# Patient Record
Sex: Female | Born: 1950
Health system: Southern US, Community
[De-identification: ages and names within clinical notes are randomized; demographics above are authoritative.]

## PROBLEM LIST (undated history)

## (undated) DIAGNOSIS — E119 Type 2 diabetes mellitus without complications: Secondary | ICD-10-CM

## (undated) DIAGNOSIS — G4730 Sleep apnea, unspecified: Secondary | ICD-10-CM

## (undated) DIAGNOSIS — K219 Gastro-esophageal reflux disease without esophagitis: Secondary | ICD-10-CM

## (undated) DIAGNOSIS — C801 Malignant (primary) neoplasm, unspecified: Secondary | ICD-10-CM

## (undated) DIAGNOSIS — J40 Bronchitis, not specified as acute or chronic: Secondary | ICD-10-CM

## (undated) DIAGNOSIS — R609 Edema, unspecified: Secondary | ICD-10-CM

## (undated) DIAGNOSIS — B159 Hepatitis A without hepatic coma: Secondary | ICD-10-CM

## (undated) DIAGNOSIS — E079 Disorder of thyroid, unspecified: Secondary | ICD-10-CM

## (undated) DIAGNOSIS — R600 Localized edema: Secondary | ICD-10-CM

## (undated) HISTORY — PX: CHOLECYSTECTOMY: SHX55

## (undated) HISTORY — PX: BREAST LUMPECTOMY: SHX2

## (undated) HISTORY — DX: Hepatitis a without hepatic coma: B15.9

## (undated) HISTORY — DX: Disorder of thyroid, unspecified: E07.9

## (undated) HISTORY — DX: Sleep apnea, unspecified: G47.30

---

## 2005-05-22 ENCOUNTER — Ambulatory Visit: Payer: Self-pay | Admitting: Family Medicine

## 2007-07-15 ENCOUNTER — Ambulatory Visit (HOSPITAL_COMMUNITY): Admission: RE | Admit: 2007-07-15 | Discharge: 2007-07-15 | Payer: Self-pay | Admitting: *Deleted

## 2007-07-15 ENCOUNTER — Encounter (INDEPENDENT_AMBULATORY_CARE_PROVIDER_SITE_OTHER): Payer: Self-pay | Admitting: *Deleted

## 2008-06-11 ENCOUNTER — Encounter: Admission: RE | Admit: 2008-06-11 | Discharge: 2008-06-11 | Payer: Self-pay | Admitting: Internal Medicine

## 2009-08-15 ENCOUNTER — Ambulatory Visit: Payer: Self-pay | Admitting: Rheumatology

## 2010-09-09 NOTE — Op Note (Signed)
NAMECHANTIL, Monica Rocha            ACCOUNT NO.:  [id number]   MEDICAL RECORD NO.:  [id number]          PATIENT TYPE:  AMB   LOCATION:  ENDO                         FACILITY:  [facility]   PHYSICIAN:  Georgiana Spinner, M.D.    DATE OF BIRTH:  1950/08/03   DATE OF PROCEDURE:  DATE OF DISCHARGE:                               OPERATIVE REPORT   PROCEDURE:  Colonoscopy with biopsy.   INDICATIONS:  Colon polyps.   ANESTHESIA:  Fentanyl 100 mcg, Versed 10 mg.   PROCEDURE:  With the patient mildly sedated in the left lateral  decubitus position, the Pentax videoscopic colonoscope was inserted in  the rectum and passed under direct vision to the cecum identified by the  ileocecal valve and appendiceal orifice. The cecum was full of vegetable  type material but I was able to wash and suction a number of times and  able to get all of this out and view the base of the cecum after I had  photographed it. From this point, the colonoscope was slowly withdrawn  taking circumferential views of the colonic mucosa as we withdrew to the  rectum stopping only in the descending colon where two polyps were seen.  Photographs were taken and the polyps were removed using hot biopsy  forceps technique setting of 20/150 blended current. In the rectum, the  endoscope was placed in retroflexed view to view the anal canal from  above. The endoscope was straightened and withdrawn.  The patient's  vital signs and pulse oximeter remained stable.  The patient tolerated  the procedure well without apparent complications.   FINDINGS:  Two small polyps removed by hot biopsy forceps technique.   PLAN:  Await biopsy reports.  The patient will call me for results and  follow-up with me as an outpatient.  Internal hemorrhoids were also  noted.           ______________________________  Georgiana Spinner, M.D.     GMO/MEDQ  D:  07/15/2007  T:  07/15/2007  Job:  [id number]

## 2013-12-11 ENCOUNTER — Emergency Department: Payer: Self-pay | Admitting: Emergency Medicine

## 2016-05-23 ENCOUNTER — Encounter: Payer: Self-pay | Admitting: *Deleted

## 2016-05-23 ENCOUNTER — Emergency Department
Admission: EM | Admit: 2016-05-23 | Discharge: 2016-05-23 | Disposition: A | Discharge status: Home / Self Care | Payer: Federal, State, Local not specified - PPO | Attending: Student in an Organized Health Care Education/Training Program | Admitting: Student in an Organized Health Care Education/Training Program

## 2016-05-23 ENCOUNTER — Emergency Department: Payer: Federal, State, Local not specified - PPO

## 2016-05-23 DIAGNOSIS — E119 Type 2 diabetes mellitus without complications: Secondary | ICD-10-CM | POA: Insufficient documentation

## 2016-05-23 DIAGNOSIS — R69 Illness, unspecified: Secondary | ICD-10-CM

## 2016-05-23 DIAGNOSIS — J40 Bronchitis, not specified as acute or chronic: Secondary | ICD-10-CM

## 2016-05-23 DIAGNOSIS — J111 Influenza due to unidentified influenza virus with other respiratory manifestations: Secondary | ICD-10-CM

## 2016-05-23 DIAGNOSIS — R05 Cough: Secondary | ICD-10-CM | POA: Diagnosis present

## 2016-05-23 HISTORY — DX: Malignant (primary) neoplasm, unspecified: C80.1

## 2016-05-23 HISTORY — DX: Type 2 diabetes mellitus without complications: E11.9

## 2016-05-23 HISTORY — DX: Gastro-esophageal reflux disease without esophagitis: K21.9

## 2016-05-23 LAB — CBC WITH DIFFERENTIAL/PLATELET
BASOS ABS: 0.2 10*3/uL — AB (ref 0–0.1)
Basophils Relative: 2 %
EOS PCT: 2 %
Eosinophils Absolute: 0.2 10*3/uL (ref 0–0.7)
HCT: 41.5 % (ref 35.0–47.0)
HEMOGLOBIN: 13.8 g/dL (ref 12.0–16.0)
LYMPHS ABS: 1.2 10*3/uL (ref 1.0–3.6)
Lymphocytes Relative: 10 %
MCH: 27.3 pg (ref 26.0–34.0)
MCHC: 33.2 g/dL (ref 32.0–36.0)
MCV: 82.3 fL (ref 80.0–100.0)
MONO ABS: 0.8 10*3/uL (ref 0.2–0.9)
MONOS PCT: 7 %
NEUTROS PCT: 79 %
Neutro Abs: 9.5 10*3/uL — ABNORMAL HIGH (ref 1.4–6.5)
PLATELETS: 217 10*3/uL (ref 150–440)
RBC: 5.04 MIL/uL (ref 3.80–5.20)
RDW: 13.7 % (ref 11.5–14.5)
WBC: 11.9 10*3/uL — AB (ref 3.6–11.0)

## 2016-05-23 LAB — COMPREHENSIVE METABOLIC PANEL
ALBUMIN: 3.3 g/dL — AB (ref 3.5–5.0)
ALK PHOS: 77 U/L (ref 38–126)
ALT: 16 U/L (ref 14–54)
AST: 20 U/L (ref 15–41)
Anion gap: 5 (ref 5–15)
BUN: 13 mg/dL (ref 6–20)
CALCIUM: 9.4 mg/dL (ref 8.9–10.3)
CHLORIDE: 105 mmol/L (ref 101–111)
CO2: 28 mmol/L (ref 22–32)
CREATININE: 1.03 mg/dL — AB (ref 0.44–1.00)
GFR calc non Af Amer: 57 mL/min — ABNORMAL LOW (ref >60)
GLUCOSE: 98 mg/dL (ref 65–99)
Potassium: 4.1 mmol/L (ref 3.5–5.1)
SODIUM: 138 mmol/L (ref 135–145)
Total Bilirubin: 0.6 mg/dL (ref 0.3–1.2)
Total Protein: 6.9 g/dL (ref 6.5–8.1)

## 2016-05-23 LAB — INFLUENZA PANEL BY PCR (TYPE A & B)
INFLAPCR: NEGATIVE
Influenza B By PCR: NEGATIVE

## 2016-05-23 LAB — TROPONIN I: Troponin I: <0.03 ng/mL (ref <0.03)

## 2016-05-23 MED ORDER — ALBUTEROL SULFATE HFA 108 (90 BASE) MCG/ACT IN AERS: 2.0000 | INHALATION_SPRAY | Freq: Four times a day (QID) | RESPIRATORY_TRACT | 2 refills | Status: DC | PRN

## 2016-05-23 MED ORDER — AZITHROMYCIN 250 MG PO TABS: ORAL_TABLET | ORAL | 0 refills | Status: DC

## 2016-05-23 MED ORDER — DEXAMETHASONE SODIUM PHOSPHATE 10 MG/ML IJ SOLN
10.0000 mg | Freq: Once | INTRAMUSCULAR | Status: AC
  Administered 2016-05-23: 10 mg via INTRAVENOUS
  Filled 2016-05-23: qty 1

## 2016-05-23 MED ORDER — ALBUTEROL SULFATE (2.5 MG/3ML) 0.083% IN NEBU
5.0000 mg | INHALATION_SOLUTION | Freq: Once | RESPIRATORY_TRACT | Status: AC
  Administered 2016-05-23: 5 mg via RESPIRATORY_TRACT
  Filled 2016-05-23: qty 6

## 2016-05-23 NOTE — ED Triage Notes (Signed)
Pt c/o flu-like sxs x 3 days, no fever, but chills. Pt states ears, throat and chest hurt. Pain is worse w/ cough. Pt states this morning she was "running from room to room and outside because she could not breath". Pt drove self to ED, ambulatory to room for triage. Pt has weak, non-productive cough during triage.

## 2016-05-23 NOTE — ED Provider Notes (Signed)
[facility] Emergency Department Provider Note    First MD Initiated Contact with Patient 05/23/16 [phone]     (approximate)  I have reviewed the triage vital signs and the nursing notes.   HISTORY  Chief Complaint Cough and Influenza    HPI Monica Rocha is a 66 y.o. female history of breast cancer and diabetes with S reflux presents with flulike symptoms for the last 3 days. Denies any measured fevers but has felt chills. States that her throat and sinuses are congested also has been having a dry nonproductive cough.  Does not smoke. She denies a chest. Does have some discomfort when coughing. States this morning she woke up from bed due to coughing spell. She is feeling severely short of breath and that she could not cough up the phlegm. States that she then had an episode of posttussive emesis.    Past Medical History:  Diagnosis Date  . Acid reflux   . Cancer ([place])   . Diabetes mellitus without complication ([place])    History reviewed. No pertinent family history. Past Surgical History:  Procedure Laterality Date  . BREAST LUMPECTOMY Left   . CHOLECYSTECTOMY     There are no active problems to display for this patient.     Prior to Admission medications   Medication Sig Start Date End Date Taking? Authorizing Provider  albuterol (PROVENTIL HFA;VENTOLIN HFA) 108 (90 Base) MCG/ACT inhaler Inhale 2 puffs into the lungs every 6 (six) hours as needed for wheezing or shortness of breath. 05/23/16   Merlyn Lot, MD  azithromycin (ZITHROMAX Z-PAK) 250 MG tablet Take two pills on day one, then take one pill daily until complete 05/23/16   Merlyn Lot, MD    Allergies Penicillins    Social History Social History  Substance Use Topics  . Smoking status: Never Smoker  . Smokeless tobacco: Never Used  . Alcohol use No    Review of Systems Patient denies headaches, rhinorrhea, blurry vision, numbness, shortness of breath, chest pain,  edema, cough, abdominal pain, nausea, vomiting, diarrhea, dysuria, fevers, rashes or hallucinations unless otherwise stated above in HPI. ____________________________________________   PHYSICAL EXAM:  VITAL SIGNS: Vitals:   05/23/16 0800 05/23/16 0830  BP: (!) 122/94 121/80  Pulse: 99 88  Resp: (!) 28 (!) 22  Temp:      Constitutional: Alert and oriented. Well appearing and in no acute distress. Eyes: Conjunctivae are normal. PERRL. EOMI. Head: Atraumatic. Nose: No congestion/rhinnorhea. Mouth/Throat: Mucous membranes are moist.  Oropharynx non-erythematous. Neck: No stridor. Painless ROM. No cervical spine tenderness to palpation Hematological/Lymphatic/Immunilogical: No cervical lymphadenopathy. Cardiovascular: Normal rate, regular rhythm. Grossly normal heart sounds.  Good peripheral circulation. Respiratory: Normal respiratory effort.  No retractions. Lungs coarse bilateral breathsounds. Gastrointestinal: Soft and nontender. No distention. No abdominal bruits. No CVA tenderness. Musculoskeletal: No lower extremity tenderness nor edema.  No joint effusions. Neurologic:  Normal speech and language. No gross focal neurologic deficits are appreciated. No gait instability. Skin:  Skin is warm, dry and intact. No rash noted. Psychiatric: Mood and affect are normal. Speech and behavior are normal.  ____________________________________________   LABS (all labs ordered are listed, but only abnormal results are displayed)  Results for orders placed or performed during the hospital encounter of 05/23/16 (from the past 24 hour(s))  CBC with Differential/Platelet     Status: Abnormal   Collection Time: 05/23/16  7:12 AM  Result Value Ref Range   WBC 11.9 (H) 3.6 - 11.0 K/uL  RBC 5.04 3.80 - 5.20 MIL/uL   Hemoglobin 13.8 12.0 - 16.0 g/dL   HCT 41.5 35.0 - 47.0 %   MCV 82.3 80.0 - 100.0 fL   MCH 27.3 26.0 - 34.0 pg   MCHC 33.2 32.0 - 36.0 g/dL   RDW 13.7 11.5 - 14.5 %   Platelets  217 150 - 440 K/uL   Neutrophils Relative % 79 %   Lymphocytes Relative 10 %   Monocytes Relative 7 %   Eosinophils Relative 2 %   Basophils Relative 2 %   Neutro Abs 9.5 (H) 1.4 - 6.5 K/uL   Lymphs Abs 1.2 1.0 - 3.6 K/uL   Monocytes Absolute 0.8 0.2 - 0.9 K/uL   Eosinophils Absolute 0.2 0 - 0.7 K/uL   Basophils Absolute 0.2 (H) 0 - 0.1 K/uL  Comprehensive metabolic panel     Status: Abnormal   Collection Time: 05/23/16  7:12 AM  Result Value Ref Range   Sodium 138 135 - 145 mmol/L   Potassium 4.1 3.5 - 5.1 mmol/L   Chloride 105 101 - 111 mmol/L   CO2 28 22 - 32 mmol/L   Glucose, Bld 98 65 - 99 mg/dL   BUN 13 6 - 20 mg/dL   Creatinine, Ser 1.03 (H) 0.44 - 1.00 mg/dL   Calcium 9.4 8.9 - 10.3 mg/dL   Total Protein 6.9 6.5 - 8.1 g/dL   Albumin 3.3 (L) 3.5 - 5.0 g/dL   AST 20 15 - 41 U/L   ALT 16 14 - 54 U/L   Alkaline Phosphatase 77 38 - 126 U/L   Total Bilirubin 0.6 0.3 - 1.2 mg/dL   GFR calc non Af Amer 57 (L) >60 mL/min   GFR calc Af Amer >60 >60 mL/min   Anion gap 5 5 - 15  Troponin I     Status: None   Collection Time: 05/23/16  7:21 AM  Result Value Ref Range   Troponin I <0.03 <0.03 ng/mL  Influenza panel by PCR (type A & B)     Status: None   Collection Time: 05/23/16  7:28 AM  Result Value Ref Range   Influenza A By PCR NEGATIVE NEGATIVE   Influenza B By PCR NEGATIVE NEGATIVE   ____________________________________________  EKG My review and personal interpretation at Time: 7:19   Indication: chest pain  Rate: 90  Rhythm: sinus Axis: normal Other: normal intervals, no acute ischemia ____________________________________________  RADIOLOGY  I personally reviewed all radiographic images ordered to evaluate for the above acute complaints and reviewed radiology reports and findings.  These findings were personally discussed with the patient.  Please see medical record for radiology  report.  ____________________________________________   PROCEDURES  Procedure(s) performed:  Procedures    Critical Care performed: no ____________________________________________   INITIAL IMPRESSION / ASSESSMENT AND PLAN / ED COURSE  Pertinent labs & imaging results that were available during my care of the patient were reviewed by me and considered in my medical decision making (see chart for details).  ILI, flu, bronchtiis, chf, acs, dehydration  Monica Rocha is a 66 y.o. who presents to the ED with flulike illness as described above. The patient is afebrile and hemodynamically stable. She's not demonstrate any evidence of respiratory distress. She does have some faint coarse breath sounds suggestive of some component of pneumonia or bronchitis. She has no hypoxia and denies any severe chest pain. Flu test is negative. Chest x-ray shows no evidence of lobar infiltrate. Did have some improvement after  breathing treatment and steroids. Based on her cough as described above and duration of symptoms I will start the patient on a short course of antibiotics. At this point I do not feel the patient requires admission to the hospital but have provided strict return precautions and follow-up with primary care physician. She demonstrated good understanding of signs and symptoms for which she should return to the hospital  Patient was able to tolerate PO and was able to ambulate with a steady gait.  Have discussed with the patient and available family all diagnostics and treatments performed thus far and all questions were answered to the best of my ability. The patient demonstrates understanding and agreement with plan.       ____________________________________________   FINAL CLINICAL IMPRESSION(S) / ED DIAGNOSES  Final diagnoses:  Influenza-like illness  Bronchitis      NEW MEDICATIONS STARTED DURING THIS VISIT:  New Prescriptions   ALBUTEROL (PROVENTIL HFA;VENTOLIN HFA)  108 (90 BASE) MCG/ACT INHALER    Inhale 2 puffs into the lungs every 6 (six) hours as needed for wheezing or shortness of breath.   AZITHROMYCIN (ZITHROMAX Z-PAK) 250 MG TABLET    Take two pills on day one, then take one pill daily until complete     Note:  This document was prepared using Dragon voice recognition software and may include unintentional dictation errors.    Merlyn Lot, MD 05/23/16 [phone]

## 2016-05-23 NOTE — ED Notes (Signed)
Pt verbalized understanding of discharge instructions. NAD at this time. 

## 2016-05-23 NOTE — ED Notes (Signed)
Pt ambulated without any issues. Oxygen level remained 98-100% r/a.

## 2016-05-23 NOTE — Discharge Instructions (Signed)
Please return should she have any worsening or shortness of breath, chest pain or concerns.

## 2016-11-20 ENCOUNTER — Encounter: Payer: Self-pay | Admitting: Internal Medicine

## 2016-12-17 ENCOUNTER — Ambulatory Visit: Payer: Federal, State, Local not specified - PPO | Admitting: Internal Medicine

## 2017-02-09 ENCOUNTER — Ambulatory Visit: Payer: Federal, State, Local not specified - PPO | Admitting: Internal Medicine

## 2018-03-06 IMAGING — CR DG CHEST 2V
2 series · 3 of 3 positions shown · non-contrast
Comparison: None.

CLINICAL DATA: Shortness of Breath, flu-like symptoms.

EXAM:
CHEST  2 VIEW

[Series 1: chest pa · 0.14mm/px · 2 of 2 slices shown]
[im 1/2]
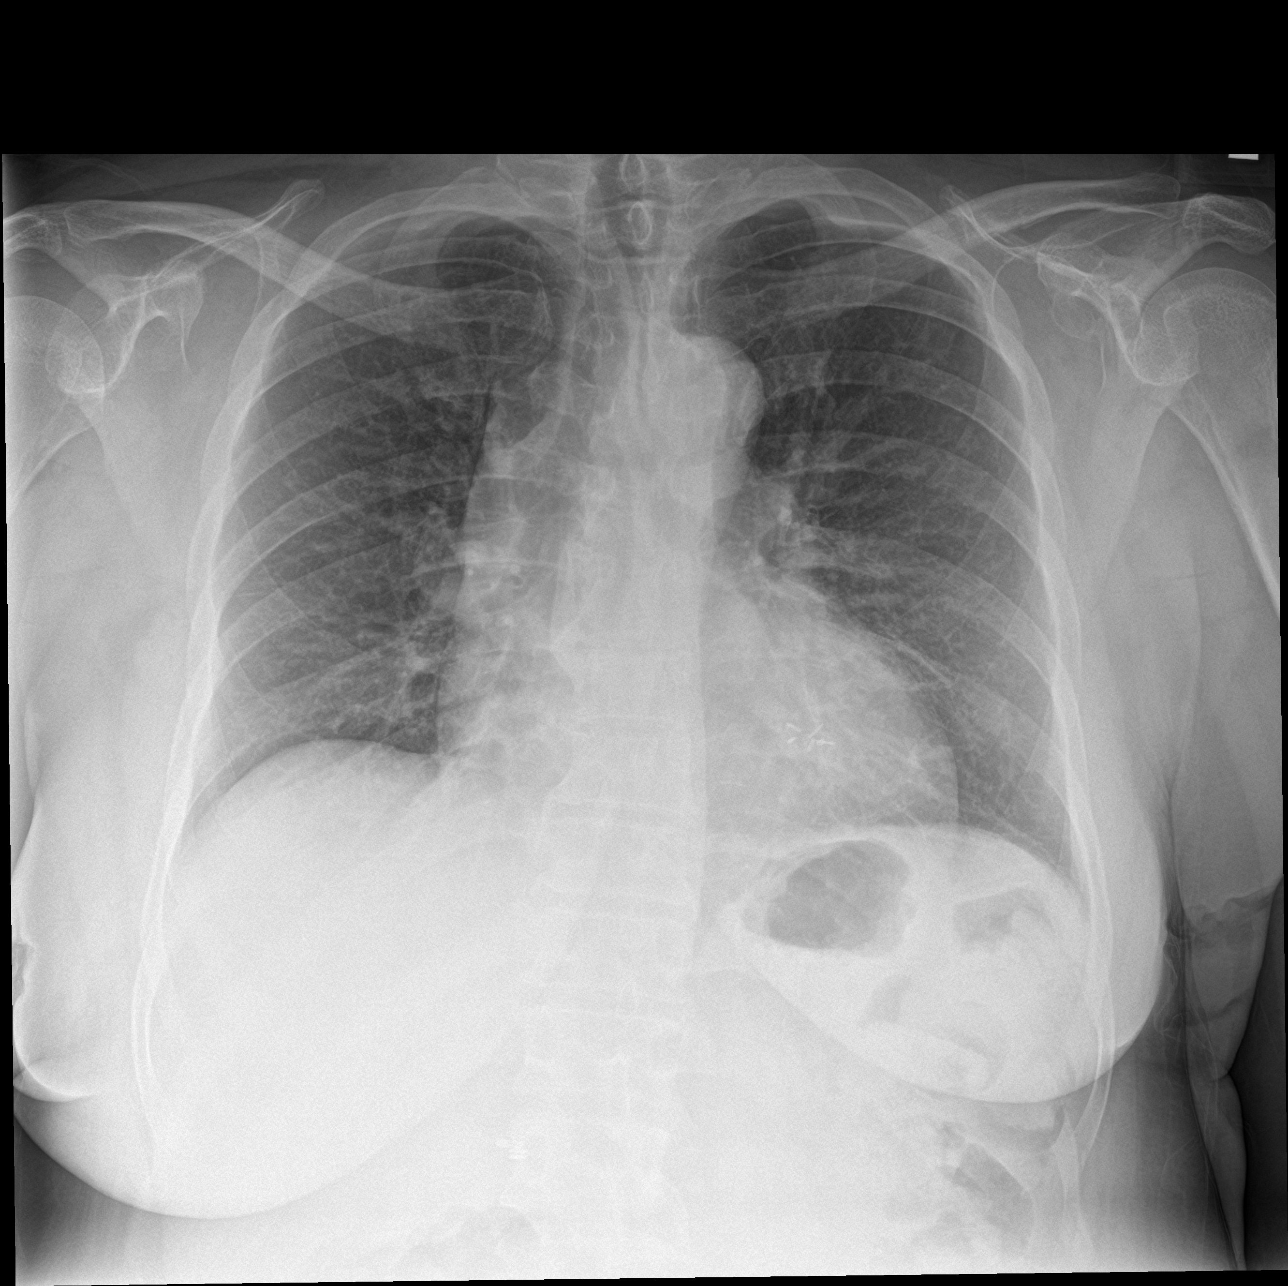
[im 2/2]
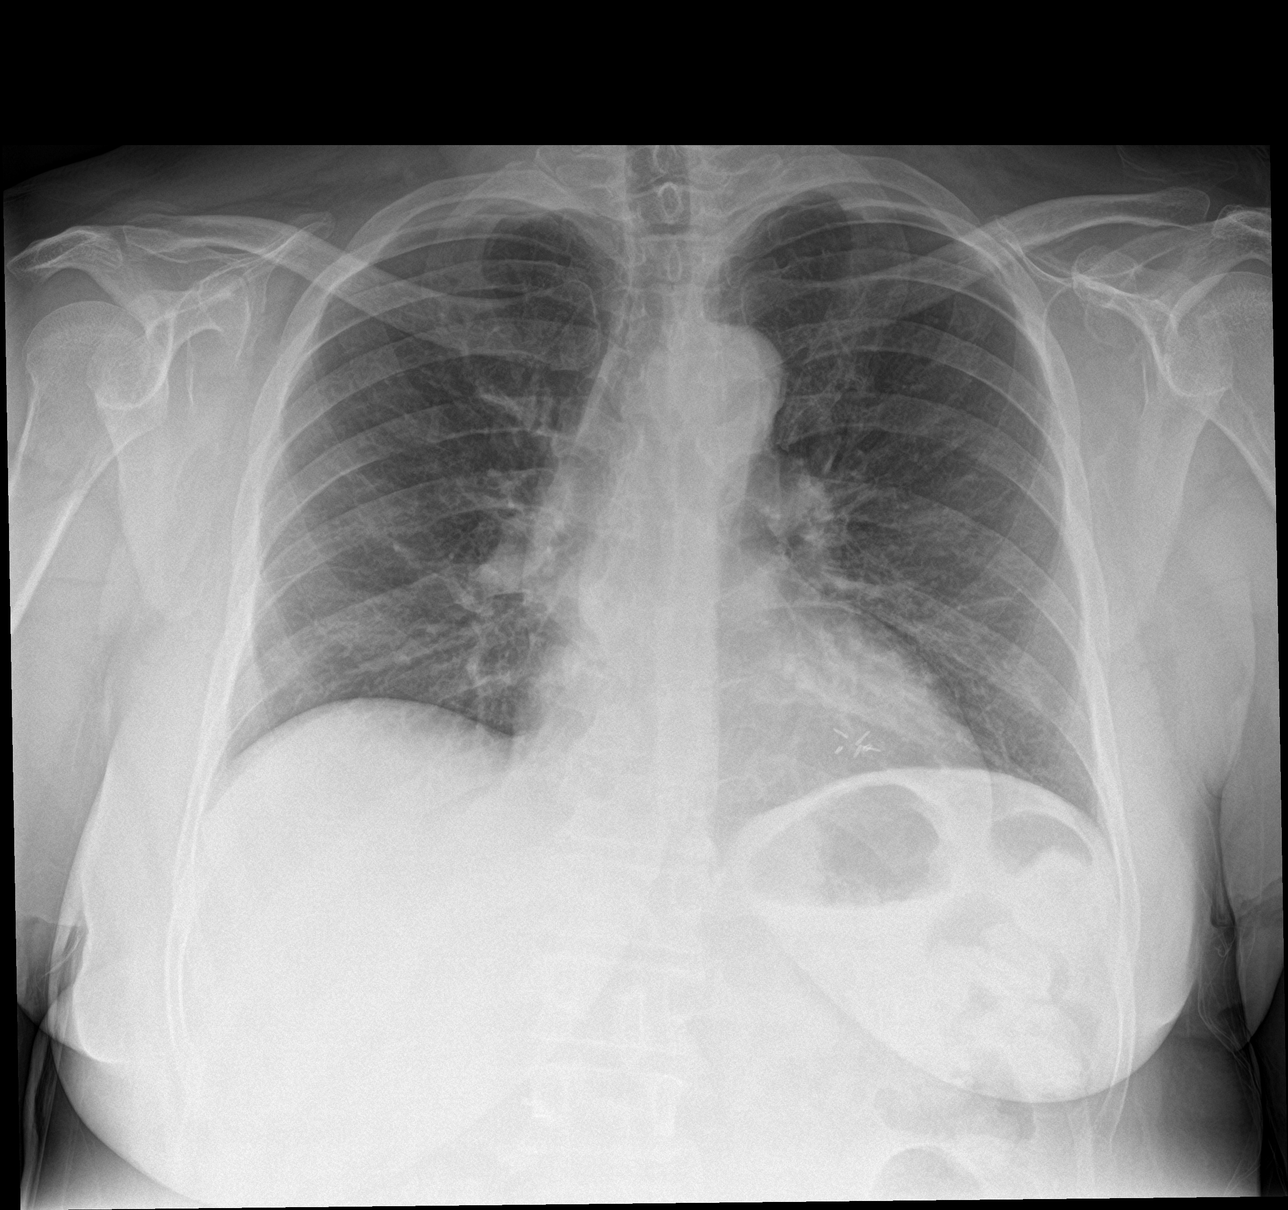

[chest lat]
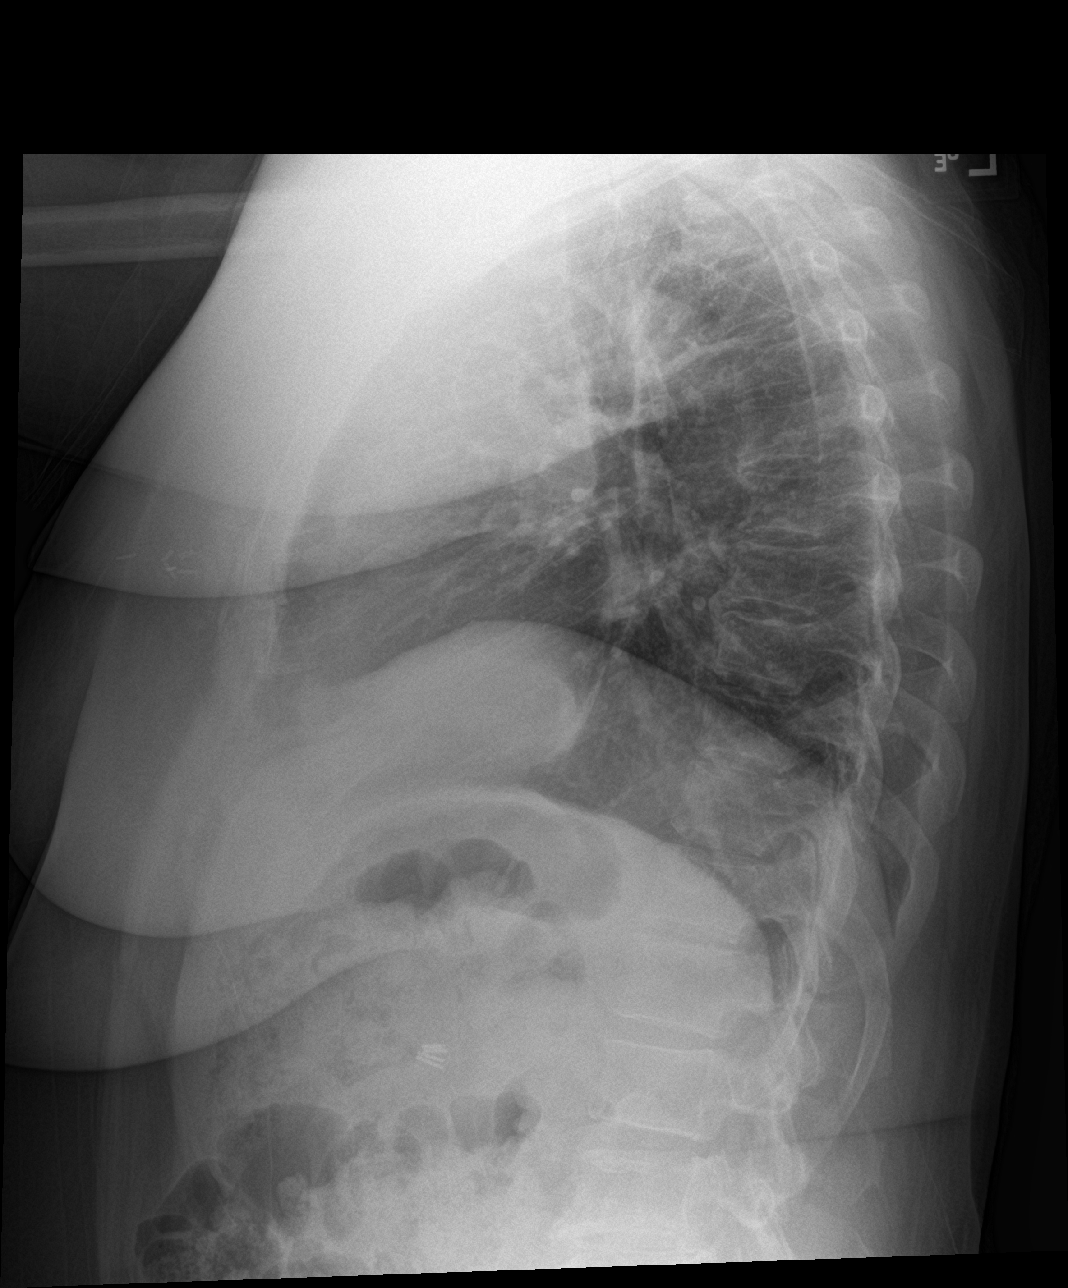

[3 of 3 positions shown; findings below may reference images not displayed]

FINDINGS: Heart is borderline in size. Lungs are clear. No effusions or acute
bony abnormality.
IMPRESSION: Cardiomegaly.  No active disease.

## 2018-09-13 ENCOUNTER — Other Ambulatory Visit: Payer: Self-pay

## 2018-09-13 ENCOUNTER — Emergency Department (HOSPITAL_BASED_OUTPATIENT_CLINIC_OR_DEPARTMENT_OTHER): Payer: Medicare Other

## 2018-09-13 ENCOUNTER — Emergency Department (HOSPITAL_BASED_OUTPATIENT_CLINIC_OR_DEPARTMENT_OTHER)
Admission: EM | Admit: 2018-09-13 | Discharge: 2018-09-13 | Disposition: A | Discharge status: Home / Self Care | Payer: Medicare Other | Attending: Emergency Medicine | Admitting: Emergency Medicine

## 2018-09-13 ENCOUNTER — Encounter (HOSPITAL_BASED_OUTPATIENT_CLINIC_OR_DEPARTMENT_OTHER): Payer: Self-pay | Admitting: Emergency Medicine

## 2018-09-13 DIAGNOSIS — Y929 Unspecified place or not applicable: Secondary | ICD-10-CM | POA: Insufficient documentation

## 2018-09-13 DIAGNOSIS — Z853 Personal history of malignant neoplasm of breast: Secondary | ICD-10-CM | POA: Diagnosis not present

## 2018-09-13 DIAGNOSIS — R2242 Localized swelling, mass and lump, left lower limb: Secondary | ICD-10-CM | POA: Insufficient documentation

## 2018-09-13 DIAGNOSIS — E119 Type 2 diabetes mellitus without complications: Secondary | ICD-10-CM | POA: Insufficient documentation

## 2018-09-13 DIAGNOSIS — Y998 Other external cause status: Secondary | ICD-10-CM | POA: Diagnosis not present

## 2018-09-13 DIAGNOSIS — R609 Edema, unspecified: Secondary | ICD-10-CM

## 2018-09-13 DIAGNOSIS — S93602A Unspecified sprain of left foot, initial encounter: Secondary | ICD-10-CM | POA: Diagnosis not present

## 2018-09-13 DIAGNOSIS — Y939 Activity, unspecified: Secondary | ICD-10-CM | POA: Insufficient documentation

## 2018-09-13 DIAGNOSIS — S99922A Unspecified injury of left foot, initial encounter: Secondary | ICD-10-CM | POA: Diagnosis present

## 2018-09-13 DIAGNOSIS — W010XXA Fall on same level from slipping, tripping and stumbling without subsequent striking against object, initial encounter: Secondary | ICD-10-CM | POA: Insufficient documentation

## 2018-09-13 HISTORY — DX: Localized edema: R60.0

## 2018-09-13 HISTORY — DX: Bronchitis, not specified as acute or chronic: J40

## 2018-09-13 HISTORY — DX: Edema, unspecified: R60.9

## 2018-09-13 NOTE — ED Triage Notes (Signed)
Miss-judged a step 4 days ago and fell, causing pain to top of left foot.  There is no obvious swelling or deformity.  No redness.  Skin is intact.  Pt sts she is concerned about having a blood clot there because she has had a blood clot in the past in her left leg.

## 2018-09-13 NOTE — ED Notes (Signed)
Pt states missed step 4 days ago c/o left ankle pain  Slight swelling

## 2018-09-13 NOTE — ED Provider Notes (Signed)
[facility] EMERGENCY DEPARTMENT Provider Note   CSN: [id number] Arrival date & time: 09/13/18  1154    History   Chief Complaint Chief Complaint  Patient presents with  . Foot Pain    HPI Monica Rocha is a 68 y.o. female.     HPI Patient presents with left foot pain.  Around 4 days ago she tripped and injured her left foot.  Scraped her right hand but otherwise no real injury.  Now has increased swelling in the left foot and on the left lower leg.  Does have a previous history of DVT.  States that was when she had been on some hormonal treatment however.  No longer on anticoagulation.  States she is worried about DVT in the foot or leg now.  No chest pain or trouble breathing.  Has been able ambulate. Past Medical History:  Diagnosis Date  . Acid reflux   . Bronchitis   . Cancer ([place])   . Diabetes mellitus without complication ([place])   . Peripheral edema     There are no active problems to display for this patient.   Past Surgical History:  Procedure Laterality Date  . BREAST LUMPECTOMY Left   . CHOLECYSTECTOMY       OB History   No obstetric history on file.      Home Medications    Prior to Admission medications   Medication Sig Start Date End Date Taking? Authorizing Provider  albuterol (PROVENTIL HFA;VENTOLIN HFA) 108 (90 Base) MCG/ACT inhaler Inhale 2 puffs into the lungs every 6 (six) hours as needed for wheezing or shortness of breath. 05/23/16   Merlyn Lot, MD  azithromycin (ZITHROMAX Z-PAK) 250 MG tablet Take two pills on day one, then take one pill daily until complete 05/23/16   Merlyn Lot, MD    Family History No family history on file.  Social History Social History   Tobacco Use  . Smoking status: Never Smoker  . Smokeless tobacco: Never Used  Substance Use Topics  . Alcohol use: No  . Drug use: No     Allergies   Penicillins   Review of Systems Review of Systems  Constitutional: Negative for appetite  change.  Cardiovascular: Positive for leg swelling. Negative for chest pain.  Musculoskeletal: Negative for back pain.       Left foot pain and swelling  Skin: Negative for wound.  Neurological: Negative for weakness and numbness.     Physical Exam Updated Vital Signs BP 133/88 (BP Location: Right Arm)   Pulse 88   Temp 98 F (36.7 C) (Oral)   Resp 16   Ht 5' 2.5" (1.588 m)   Wt 96.2 kg   SpO2 97%   BMI 38.16 kg/m   Physical Exam Vitals signs and nursing note reviewed.  Cardiovascular:     Rate and Rhythm: Normal rate and regular rhythm.  Pulmonary:     Breath sounds: No wheezing.  Abdominal:     Tenderness: There is no abdominal tenderness.  Musculoskeletal:     Right lower leg: Edema present.     Left lower leg: Edema present.     Comments: Pitting edema bilateral lower extremities, worse on left lower leg compared to right.  Also some tenderness over midfoot on left.  No tenderness over ankle.  Good range of motion right ankle.  Skin intact.  Skin:    Capillary Refill: Capillary refill takes less than 2 seconds.  Neurological:     Mental Status: She is  alert.      ED Treatments / Results  Labs (all labs ordered are listed, but only abnormal results are displayed) Labs Reviewed - No data to display  EKG None  Radiology US Venous Img Lower Unilateral Left  Result Date: 09/13/2018 CLINICAL DATA:  Left lower extremity pain and swelling, recent fall EXAM: LEFT LOWER EXTREMITY VENOUS DOPPLER ULTRASOUND TECHNIQUE: Gray-scale sonography with graded compression, as well as color Doppler and duplex ultrasound were performed to evaluate the lower extremity deep venous systems from the level of the common femoral vein and including the common femoral, femoral, profunda femoral, popliteal and calf veins including the posterior tibial, peroneal and gastrocnemius veins when visible. The superficial great saphenous vein was also interrogated. Spectral Doppler was utilized to  evaluate flow at rest and with distal augmentation maneuvers in the common femoral, femoral and popliteal veins. COMPARISON:  None. FINDINGS: Contralateral Common Femoral Vein: Respiratory phasicity is normal and symmetric with the symptomatic side. No evidence of thrombus. Normal compressibility. Common Femoral Vein: No evidence of thrombus. Normal compressibility, respiratory phasicity and response to augmentation. Saphenofemoral Junction: No evidence of thrombus. Normal compressibility and flow on color Doppler imaging. Profunda Femoral Vein: No evidence of thrombus. Normal compressibility and flow on color Doppler imaging. Femoral Vein: No evidence of thrombus. Normal compressibility, respiratory phasicity and response to augmentation. Popliteal Vein: No evidence of thrombus. Normal compressibility, respiratory phasicity and response to augmentation. Calf Veins: No evidence of thrombus. Normal compressibility and flow on color Doppler imaging. Superficial Great Saphenous Vein: No evidence of thrombus. Normal compressibility. Venous Reflux:  Not assessed Other Findings:  Subcutaneous edema noted peripherally. IMPRESSION: No evidence of deep venous thrombosis. Electronically Signed   By: Jerilynn Mages.  Shick M.D.   On: 09/13/2018 13:05   Dg Foot Complete Left  Result Date: 09/13/2018 CLINICAL DATA:  68 year old female with a history of left foot pain EXAM: LEFT FOOT - COMPLETE 3+ VIEW COMPARISON:  None. FINDINGS: No acute displaced fracture. Lateral view demonstrates nonspecific soft tissue swelling on the dorsum of the forefoot. Degenerative changes of the midfoot and the hindfoot. No radiopaque foreign body. No subluxation/dislocation. IMPRESSION: Negative for acute bony abnormality. Nonspecific soft tissue swelling on the dorsum of the forefoot Electronically Signed   By: Corrie Mckusick D.O.   On: 09/13/2018 12:38    Procedures Procedures (including critical care time)  Medications Ordered in ED Medications - No  data to display   Initial Impression / Assessment and Plan / ED Course  I have reviewed the triage vital signs and the nursing notes.  Pertinent labs & imaging results that were available during my care of the patient were reviewed by me and considered in my medical decision making (see chart for details).        Patient with foot sprain.  X-ray reassuring.  However did have edema going up the leg that was worse on left side compared to right.  Previous DVT.  Ultrasound done and is negative.  Discharge home.  Final Clinical Impressions(s) / ED Diagnoses   Final diagnoses:  None    ED Discharge Orders    None       Davonna Belling, MD 09/13/18 1321

## 2019-05-30 ENCOUNTER — Telehealth: Payer: Self-pay | Admitting: Neurology

## 2019-05-30 NOTE — Telephone Encounter (Signed)
Dr. Rolena Infante is requesting patient be seen by Dr. Jaynee Eagles, but we haven't received a referral. I have reached out to patient and we talked briefly about her getting a referral from her PCP. She will reach out to them tomorrow and make the request.

## 2019-09-05 ENCOUNTER — Encounter: Payer: Self-pay | Admitting: Neurology

## 2019-09-05 ENCOUNTER — Ambulatory Visit (INDEPENDENT_AMBULATORY_CARE_PROVIDER_SITE_OTHER): Payer: Medicare Other | Admitting: Neurology

## 2019-09-05 ENCOUNTER — Other Ambulatory Visit: Payer: Self-pay

## 2019-09-05 VITALS — BP 127/72 | HR 83 | Ht 63.0 in | Wt 215.0 lb

## 2019-09-05 DIAGNOSIS — R202 Paresthesia of skin: Secondary | ICD-10-CM | POA: Insufficient documentation

## 2019-09-05 DIAGNOSIS — E119 Type 2 diabetes mellitus without complications: Secondary | ICD-10-CM | POA: Insufficient documentation

## 2019-09-05 NOTE — Progress Notes (Signed)
PATIENT: Monica Rocha DOB: [birth year]  Chief Complaint  Patient presents with  . New Patient (Initial Visit)    RM 4, alone. PCP/Referring: Dr. Theda Sers. Vision: 20/50 with correction bilaterally.  . Peripheral Neuropathy    Complaining of neuropathy in her feet. Right leg is worse than the left. She has tried gabapentin but it did not help and made her too sleepy.     HISTORICAL  [name] is a 69 year old female, seen in request by her primary care physician Dr. Theda Sers, [place] for evaluation of peripheral neuropathy, initial evaluation was on Sep 05, 2019  I have reviewed and summarized the referring note from the referring physician.  She has past medical history of hypertension, diabetes, well-controlled, A1c was 6.2 in November 2020.  She also had a history of left breast cancer, status post surgery followed by radiation therapy, but did not require chemotherapy.  She complains of gradual onset right foot paresthesia since 2016, intermittent, usually involving right toes, plantar surface, occasionally radiating discomfort to right calf, lateral leg, lateral time, she denies significant low back pain, no gait abnormality, no bowel and bladder incontinence, occasionally left side involvement, but much less frequent, less severe  For her right foot, and leg symptoms, she take Aleve as needed less than once a week, works well, previously tried gabapentin, Lyrica, complains of drowsiness  She exercise regularly, walking 3 miles each day without difficulty  Doppler study of left lower extremity in 2020 was negative for DVT  Laboratory evaluation from [facility] here [facility] Normal CBC, hemoglobin of 14.3, CMP, creatinine of 1.0, A1c 6.2, TSH 1.08 in November 2020  REVIEW OF SYSTEMS: Full 14 system review of systems performed and notable only for as above All other review of systems were negative.  ALLERGIES: Allergies  Allergen Reactions  . Penicillins      HOME MEDICATIONS: Current Outpatient Medications  Medication Sig Dispense Refill  . aspirin 325 MG tablet Take 325 mg by mouth daily.    . Cholecalciferol 25 MCG (1000 UT) CHEW Chew by mouth.    . losartan-hydrochlorothiazide (HYZAAR) 50-12.5 MG tablet Take 1 tablet by mouth daily.    . metFORMIN (GLUCOPHAGE) 500 MG tablet Take 500 mg by mouth 2 (two) times daily with a meal.    . omeprazole (PRILOSEC) 20 MG capsule Take 20 mg by mouth daily.     No current facility-administered medications for this visit.    PAST MEDICAL HISTORY: Past Medical History:  Diagnosis Date  . Acid reflux   . Bronchitis   . Cancer ([place])   . Diabetes mellitus without complication ([place])   . Peripheral edema     PAST SURGICAL HISTORY: Past Surgical History:  Procedure Laterality Date  . BREAST LUMPECTOMY Left   . CHOLECYSTECTOMY      FAMILY HISTORY: Family History  Problem Relation Age of Onset  . Stroke Mother   . Cancer Father     SOCIAL HISTORY: Social History   Socioeconomic History  . Marital status: Divorced    Spouse name: Not on file  . Number of children: Not on file  . Years of education: Not on file  . Highest education level: Not on file  Occupational History  . Not on file  Tobacco Use  . Smoking status: Never Smoker  . Smokeless tobacco: Never Used  Substance and Sexual Activity  . Alcohol use: No  . Drug use: No  . Sexual activity: Not on file  Other Topics  Concern  . Not on file  Social History Narrative  . Not on file   Social Determinants of Health   Financial Resource Strain:   . Difficulty of Paying Living Expenses:   Food Insecurity:   . Worried About Charity fundraiser in the Last Year:   . Arboriculturist in the Last Year:   Transportation Needs:   . Film/video editor (Medical):   Marland Kitchen Lack of Transportation (Non-Medical):   Physical Activity:   . Days of Exercise per Week:   . Minutes of Exercise per Session:   Stress:   . Feeling of  Stress :   Social Connections:   . Frequency of Communication with Friends and Family:   . Frequency of Social Gatherings with Friends and Family:   . Attends Religious Services:   . Active Member of Clubs or Organizations:   . Attends Archivist Meetings:   Marland Kitchen Marital Status:   Intimate Partner Violence:   . Fear of Current or Ex-Partner:   . Emotionally Abused:   Marland Kitchen Physically Abused:   . Sexually Abused:      PHYSICAL EXAM   Vitals:   09/05/19 1024  BP: 127/72  Pulse: 83  Weight: 215 lb (97.5 kg)  Height: 5\' 3"  (1.6 m)    Not recorded      Body mass index is 38.09 kg/m.  PHYSICAL EXAMNIATION:  Gen: NAD, conversant, well nourised, well groomed                     Cardiovascular: Regular rate rhythm, no peripheral edema, warm, nontender. Eyes: Conjunctivae clear without exudates or hemorrhage Neck: Supple, no carotid bruits. Pulmonary: Clear to auscultation bilaterally   NEUROLOGICAL EXAM:  MENTAL STATUS: Speech:    Speech is normal; fluent and spontaneous with normal comprehension.  Cognition:     Orientation to time, place and person     Normal recent and remote memory     Normal Attention span and concentration     Normal Language, naming, repeating,spontaneous speech     Fund of knowledge   CRANIAL NERVES: CN II: Visual fields are full to confrontation. Pupils are round equal and briskly reactive to light. CN III, IV, VI: extraocular movement are normal. No ptosis. CN V: Facial sensation is intact to light touch CN VII: Face is symmetric with normal eye closure  CN VIII: Hearing is normal to causal conversation. CN IX, X: Phonation is normal. CN XI: Head turning and shoulder shrug are intact  MOTOR: There is no pronator drift of out-stretched arms. Muscle bulk and tone are normal. Muscle strength is normal.  REFLEXES: Reflexes are 2+ and symmetric at the biceps, triceps, knees, and ankles. Plantar responses are flexor.  SENSORY: Intact  to light touch, pinprick and vibratory sensation are intact in fingers and toes.  COORDINATION: There is no trunk or limb dysmetria noted.  GAIT/STANCE: She can get up from seated position arms crossed, steady, able to stand up on tiptoes or heels without difficulty Romberg is absent.   DIAGNOSTIC DATA (LABS, IMAGING, TESTING) - I reviewed patient records, labs, notes, testing and imaging myself where available.   ASSESSMENT AND PLAN  Avarie Muter is a 69 y.o. female   Few years history of right foot paresthesia, occasionally left foot involvement  Essentially normal neurological examination, does have a history of well-controlled diabetes, most recent A1c was 6.2 in November 2020.  The apparent asymmetry on history, suggestive of  right lumbar radiculopathy, may be with superimposed mild peripheral neuropathy,  Patient overall is functioning well, after discussed with patient, decided to hold off evaluation at this point, call clinic for worsening symptoms,  She has tried Neurontin, gabapentin for symptoms control, complains of drowsiness, it is okay to take NSAIDs as needed   Marcial Pacas, M.D. Ph.D.  [facility] [address] [place], [place] [address] Ph: [phone] Fax: [phone]  CC: Janie Morning, DO

## 2020-06-26 IMAGING — DX LEFT FOOT - COMPLETE 3+ VIEW
3 series · 3 of 3 positions shown · non-contrast
Comparison: None.

CLINICAL DATA: 67-year-old female with a history of left foot pain

EXAM:
LEFT FOOT - COMPLETE 3+ VIEW

[foot ap]
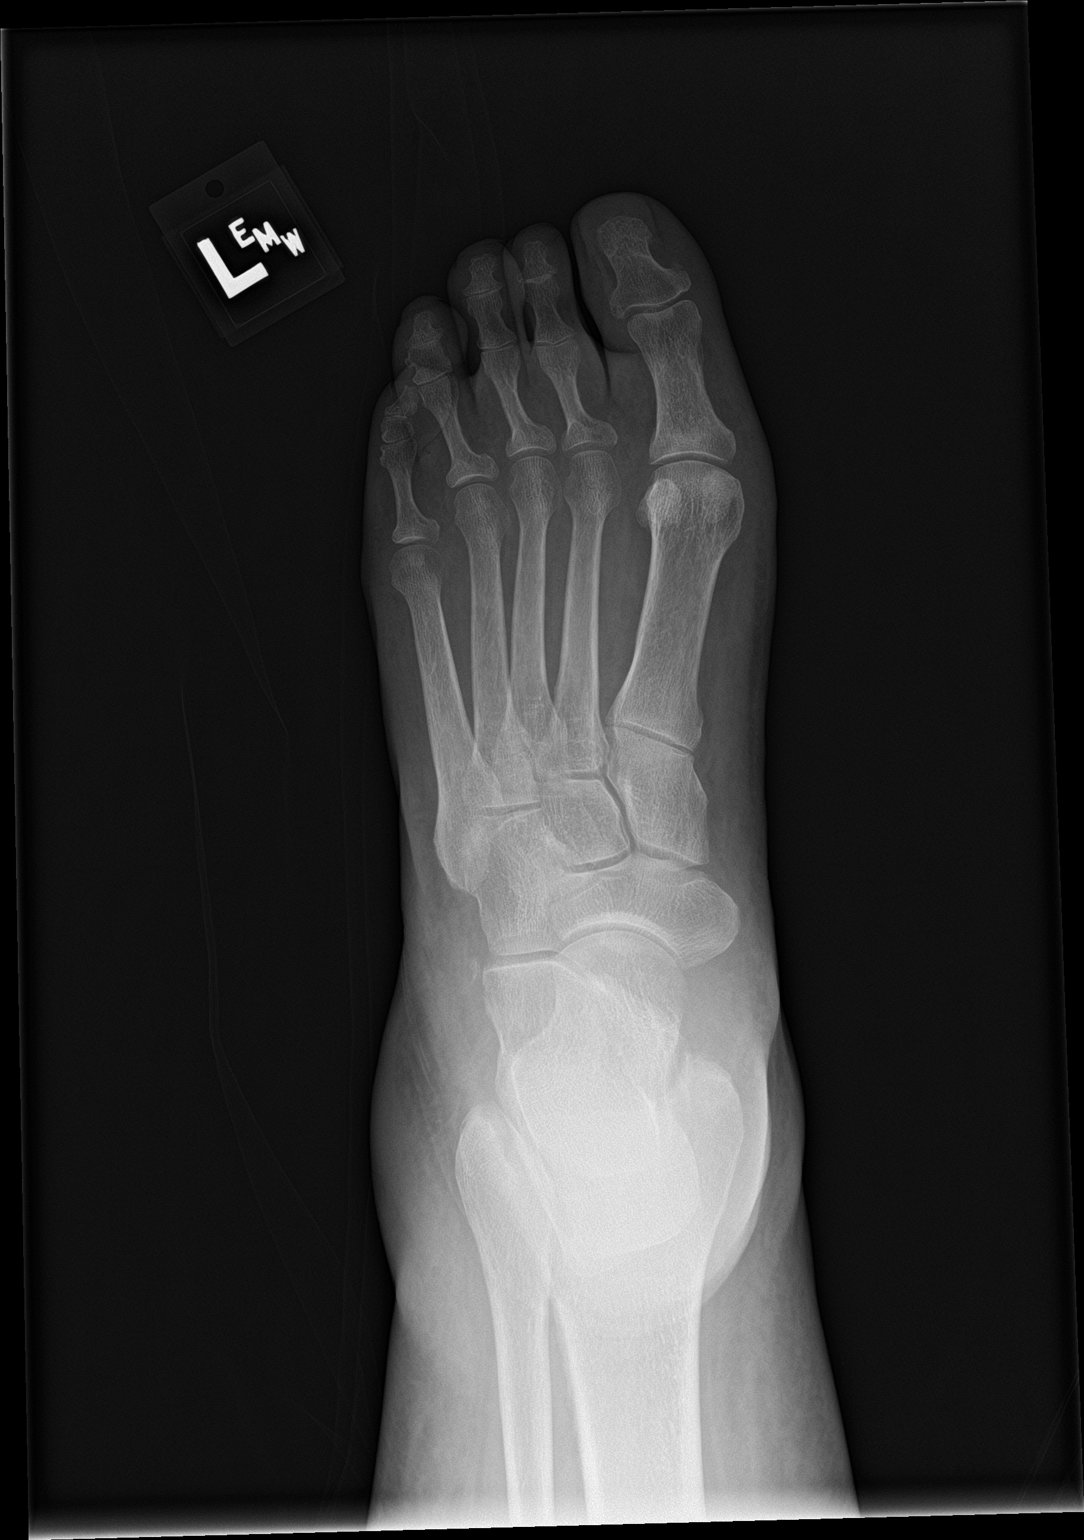

[foot obl]
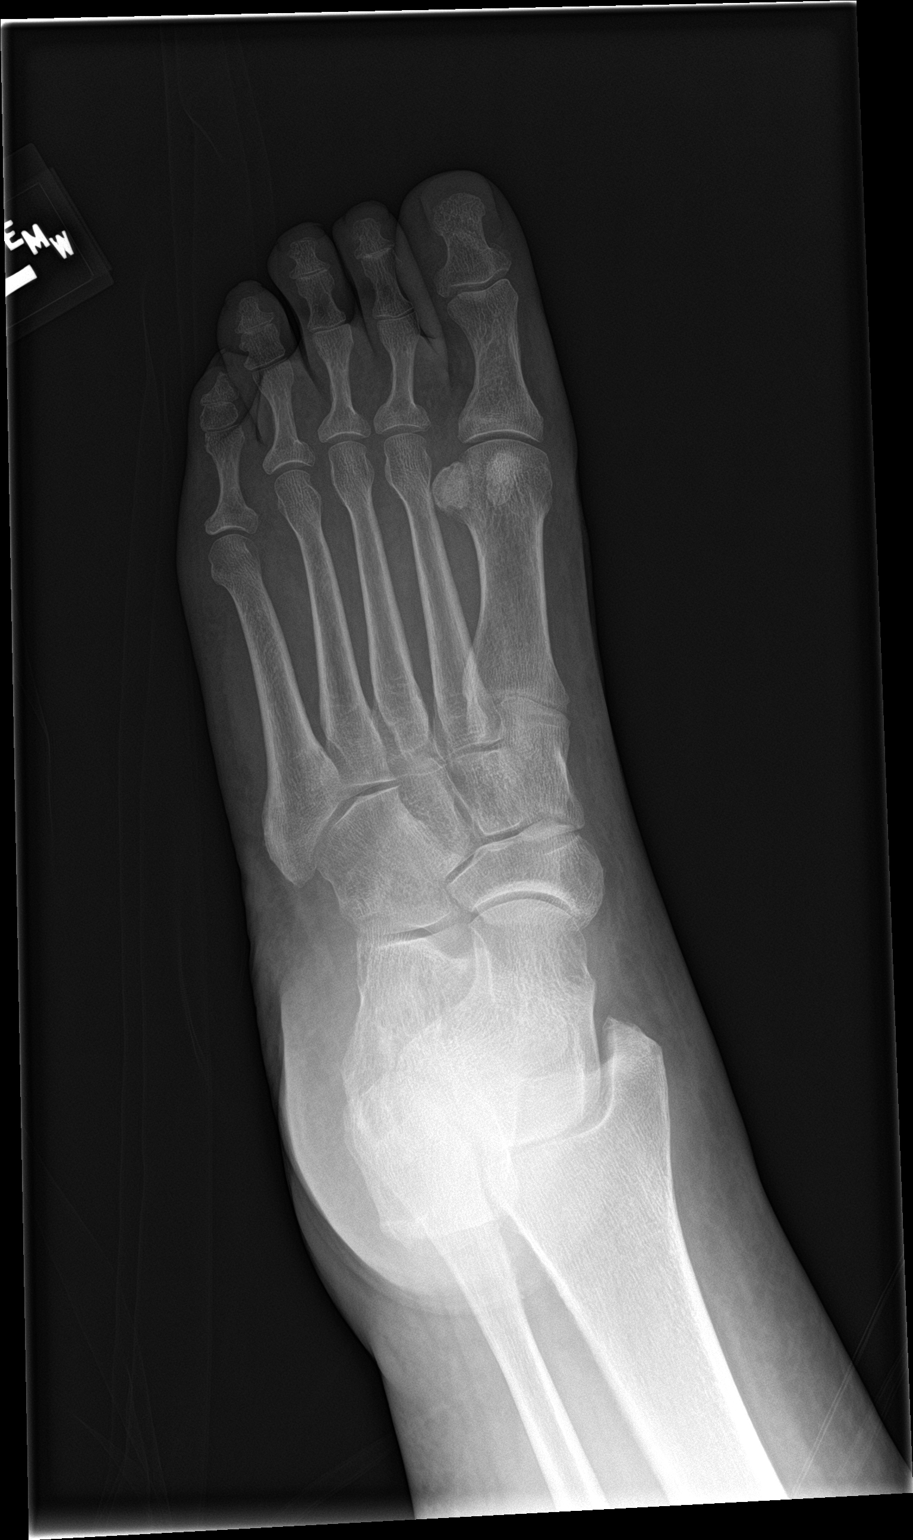

[foot lat]
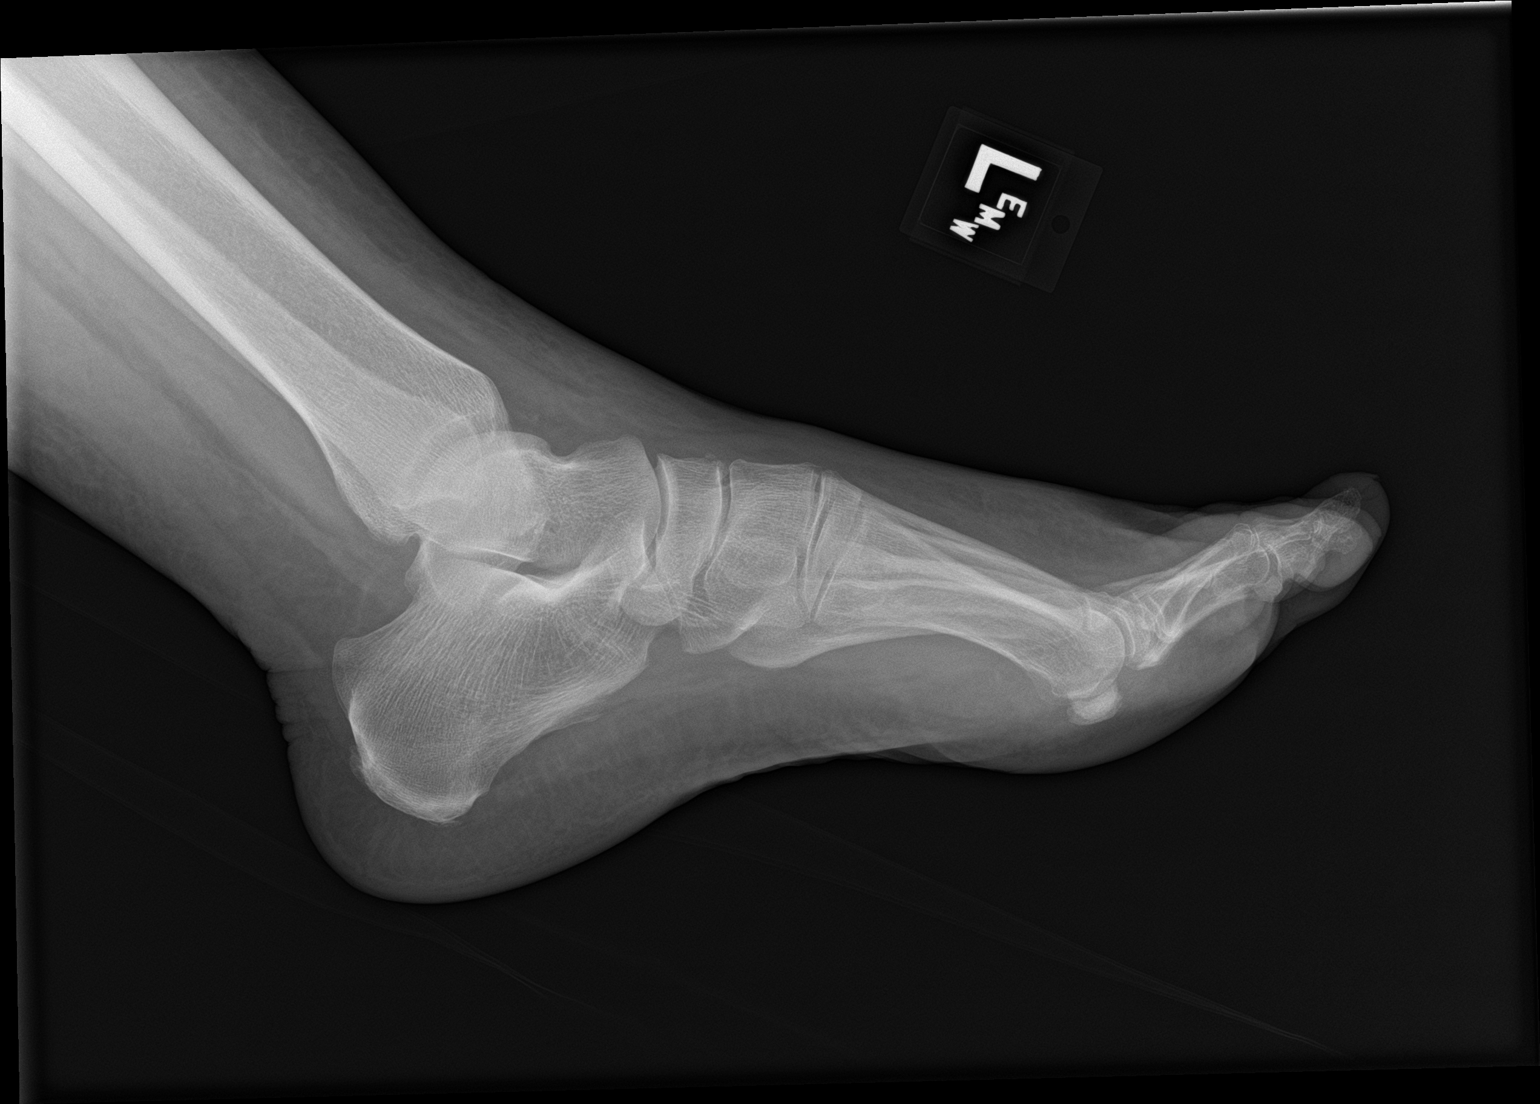

[3 of 3 positions shown; findings below may reference images not displayed]

FINDINGS: No acute displaced fracture. Lateral view demonstrates nonspecific
soft tissue swelling on the dorsum of the forefoot. Degenerative
changes of the midfoot and the hindfoot. No radiopaque foreign body.
No subluxation/dislocation.
IMPRESSION: Negative for acute bony abnormality.

Nonspecific soft tissue swelling on the dorsum of the forefoot

## 2021-04-04 ENCOUNTER — Encounter: Payer: Self-pay | Admitting: Gastroenterology

## 2021-05-09 ENCOUNTER — Encounter: Payer: Self-pay | Admitting: Gastroenterology

## 2021-05-09 ENCOUNTER — Ambulatory Visit (INDEPENDENT_AMBULATORY_CARE_PROVIDER_SITE_OTHER): Payer: Medicare Other | Admitting: Gastroenterology

## 2021-05-09 ENCOUNTER — Other Ambulatory Visit: Payer: Self-pay

## 2021-05-09 VITALS — BP 128/68 | HR 90 | Ht 63.0 in | Wt 169.4 lb

## 2021-05-09 DIAGNOSIS — K219 Gastro-esophageal reflux disease without esophagitis: Secondary | ICD-10-CM | POA: Diagnosis not present

## 2021-05-09 DIAGNOSIS — R1319 Other dysphagia: Secondary | ICD-10-CM | POA: Diagnosis not present

## 2021-05-09 NOTE — Progress Notes (Signed)
Chief Complaint: For GI eval  Referring Provider:  Janie Morning, DO      ASSESSMENT AND PLAN;   #1. GERD with eso dysphagia. D/d includes eso stricture, Schatzki's ring, motility disorder, EoE, pill induced esophagitis, r/o esophageal carcinoma/extrinsic compression d/t goiter.  #2. H/O polyps (tubular adenoma) Feb 2019  #3. IBS-C  Plan: -Omeprazole 20 mg p.o. QD, 1/2 hr before breakfast. #30.  11 refills. -Ba swallow with Ba tablet as well. -EGD with eso bx and possibly dil.  Discussed risks and benefits including small but definite risks of eso perforation, bleeding, risks of anesthesia.  The benefits were also discussed. Consent forms given. -I have instructed patient to chew foods especially meats and breads well and eat slowly. -Miralax 17g po qd to continue. -Colon 07/2022    HPI:    Monica Rocha is a 71 y.o. female  With goiter/hyperthyroidism, breast CA, DM2, OSA, GERD Referred by [facility] for GI eval  C/O Solid food dysphagia-mostly breads and meats- neck/lower chest area x 2 years, intermittently-has goiter on physical exam.  Has reflux which is better with omeprazole.  She has been on omeprazole for 5-6 years.  Had EGD April 2019 as below which showed small hiatal hernia.  She underwent dilatation to 54Fr with improvement x 2 years.  On review of previous notes-she also had EGD February 2016 and was found to have erosive gastritis.  The biopsies were consistent with chronic active gastritis with intestinal metaplasia and low-grade dysplasia.  Of note that most recent biopsies in 2019 did not show any intestinal metaplasia.  The biopsies were negative for H. pylori.  She has longstanding H/O constipation with pellet-like stools. Dx with IBS-C. Better with high-fiber diet and MiraLAX every day.  Has not tried any other medications. Negative colonoscopy except for small colonic polyp as below 07/2017.  For goiter/hyperthyroidism she does follow-up with  endocrinologist.  No melena or hematochezia.  Has history of colonic polyps as below.  No fever chills or night sweats.  No recent weight loss.  No jaundice dark urine or pale stools.  No family history of colon cancer or colonic polyps.    Previous GI work-up:  EGD 08/19/2017 (Dr Harl Favor) -Small hiatal hernia -Dilatation to 47 French -Negative esophageal biopsies for EOE. -Normal stomach/duodenum. Bx-negative.  These biopsies did not reveal any intestinal metaplasia.  Colonoscopy 08/19/2017 (Dr Harl Favor) -3 mm colonic polyp in distal ascending colon/hepatic flexure, internal hemorrhoids, otherwise normal. -Bx-tubular adenoma.  Repeat in 5 years.  Past Medical History:  Diagnosis Date   Acid reflux    Bronchitis    Cancer ([place])    Diabetes mellitus without complication ([place])    Hepatitis A    Peripheral edema    Sleep apnea    Thyroid disease     Past Surgical History:  Procedure Laterality Date   BREAST LUMPECTOMY Left    CHOLECYSTECTOMY      Family History  Problem Relation Age of Onset   Stroke Mother    Cancer Father    Cancer Sister        gall bladder   Colon cancer Neg Hx    Rectal cancer Neg Hx     Social History   Tobacco Use   Smoking status: Never   Smokeless tobacco: Never  Vaping Use   Vaping Use: Never used  Substance Use Topics   Alcohol use: No   Drug use: No    Current Outpatient Medications  Medication Sig Dispense Refill  aspirin 325 MG tablet Take 325 mg by mouth daily.     losartan-hydrochlorothiazide (HYZAAR) 50-12.5 MG tablet Take 1 tablet by mouth daily.     metFORMIN (GLUCOPHAGE) 500 MG tablet Take 500 mg by mouth 2 (two) times daily with a meal.     methimazole (TAPAZOLE) 10 MG tablet Take 10 mg by mouth 2 (two) times daily.     metoprolol succinate (TOPROL-XL) 25 MG 24 hr tablet Take 25 mg by mouth daily.     omeprazole (PRILOSEC) 20 MG capsule Take 20 mg by mouth daily.     No current  facility-administered medications for this visit.    Allergies  Allergen Reactions   Penicillins     Review of Systems:  Constitutional: Denies fever, chills, diaphoresis, appetite change and fatigue.  HEENT: Denies photophobia, eye pain, redness, hearing loss, ear pain, congestion, sore throat, rhinorrhea, sneezing, mouth sores, neck pain, neck stiffness and tinnitus.   Respiratory: Denies SOB, DOE, cough, chest tightness,  and wheezing.   Cardiovascular: Denies chest pain, palpitations and leg swelling.  Genitourinary: Denies dysuria, urgency, frequency, hematuria, flank pain and difficulty urinating.  Musculoskeletal: Denies myalgias, back pain, joint swelling, arthralgias and gait problem.  Skin: No rash.  Neurological: Denies dizziness, seizures, syncope, weakness, light-headedness, numbness and headaches.  Hematological: Denies adenopathy. Easy bruising, personal or family bleeding history  Psychiatric/Behavioral: No anxiety or depression     Physical Exam:    BP 128/68    Pulse 90    Ht 5\' 3"  (1.6 m)    Wt 169 lb 6 oz (76.8 kg)    SpO2 98%    BMI 30.00 kg/m  Wt Readings from Last 3 Encounters:  05/09/21 169 lb 6 oz (76.8 kg)  09/05/19 215 lb (97.5 kg)  09/13/18 212 lb (96.2 kg)   Constitutional:  Well-developed, in no acute distress. Psychiatric: Normal mood and affect. Behavior is normal. HEENT: Pupils normal.  Conjunctivae are normal. No scleral icterus. Neck supple.  Has goiter Cardiovascular: Normal rate, regular rhythm. No edema Pulmonary/chest: Effort normal and breath sounds normal. No wheezing, rales or rhonchi. Abdominal: Soft, nondistended. Nontender. Bowel sounds active throughout. There are no masses palpable. No hepatomegaly. Rectal: Deferred Neurological: Alert and oriented to person place and time. Skin: Skin is warm and dry. No rashes noted.  Data Reviewed: I have personally reviewed following labs and imaging studies  CBC: CBC Latest Ref Rng &  Units 05/23/2016  WBC 3.6 - 11.0 K/uL 11.9(H)  Hemoglobin 12.0 - 16.0 g/dL 13.8  Hematocrit 35.0 - 47.0 % 41.5  Platelets 150 - 440 K/uL 217    CMP: CMP Latest Ref Rng & Units 05/23/2016  Glucose 65 - 99 mg/dL 98  BUN 6 - 20 mg/dL 13  Creatinine 0.44 - 1.00 mg/dL 1.03(H)  Sodium 135 - 145 mmol/L 138  Potassium 3.5 - 5.1 mmol/L 4.1  Chloride 101 - 111 mmol/L 105  CO2 22 - 32 mmol/L 28  Calcium 8.9 - 10.3 mg/dL 9.4  Total Protein 6.5 - 8.1 g/dL 6.9  Total Bilirubin 0.3 - 1.2 mg/dL 0.6  Alkaline Phos 38 - 126 U/L 77  AST 15 - 41 U/L 20  ALT 14 - 54 U/L 16    GFR: CrCl cannot be calculated (Patient's most recent lab result is older than the maximum 21 days allowed.). Liver Function Tests: No results for input(s): AST, ALT, ALKPHOS, BILITOT, PROT, ALBUMIN in the last 168 hours. No results for input(s): LIPASE, AMYLASE in the last 168  hours. No results for input(s): AMMONIA in the last 168 hours. Coagulation Profile: No results for input(s): INR, PROTIME in the last 168 hours. HbA1C: No results for input(s): HGBA1C in the last 72 hours. Lipid Profile: No results for input(s): CHOL, HDL, LDLCALC, TRIG, CHOLHDL, LDLDIRECT in the last 72 hours. Thyroid Function Tests: No results for input(s): TSH, T4TOTAL, FREET4, T3FREE, THYROIDAB in the last 72 hours. Anemia Panel: No results for input(s): VITAMINB12, FOLATE, FERRITIN, TIBC, IRON, RETICCTPCT in the last 72 hours.  No results found for this or any previous visit (from the past 240 hour(s)).    Radiology Studies: No results found.    Carmell Austria, MD 05/09/2021, 10:25 AM  Cc: Janie Morning, DO

## 2021-05-09 NOTE — Patient Instructions (Addendum)
If you are age 69 or older, your body mass index should be between 23-30. Your Body mass index is 30 kg/m. If this is out of the aforementioned range listed, please consider follow up with your Primary Care Provider.  If you are age 69 or younger, your body mass index should be between 19-25. Your Body mass index is 30 kg/m. If this is out of the aformentioned range listed, please consider follow up with your Primary Care Provider.   ________________________________________________________  The [place] [facility] providers would like to encourage you to use [facility] to communicate with providers for non-urgent requests or questions.  Due to long hold times on the telephone, sending your provider a message by [facility] may be a faster and more efficient way to get a response.  Please allow 69 business hours for a response.  Please remember that this is for non-urgent requests.  _______________________________________________________  Please purchase the following medications over the counter and take as directed: Miralax 17g daily  You have been scheduled for an endoscopy. Please follow written instructions given to you at your visit today. If you use inhalers (even only as needed), please bring them with you on the day of your procedure.  Continue omeprazole.  Repeat colonoscopy 07-2022. Please call 2 months prior to this time to see about scheduling. We will send a reminder letter as it gets closer to the time.  You have been scheduled for a Barium Esophogram at [facility] (1st floor of the hospital) on 69-20-2023 at 11am. Please arrive 30 minutes prior to your appointment for registration. Make certain not to have anything to eat or drink 3 hours prior to your test. If you need to reschedule for any reason, please contact radiology at [phone] to do so. __________________________________________________________________ A barium swallow is an examination that concentrates on views of the  esophagus. This tends to be a double contrast exam (barium and two liquids which, when combined, create a gas to distend the wall of the oesophagus) or single contrast (non-ionic iodine based). The study is usually tailored to your symptoms so a good history is essential. Attention is paid during the study to the form, structure and configuration of the esophagus, looking for functional disorders (such as aspiration, dysphagia, achalasia, motility and reflux) EXAMINATION You may be asked to change into a gown, depending on the type of swallow being performed. A radiologist and radiographer will perform the procedure. The radiologist will advise you of the type of contrast selected for your procedure and direct you during the exam. You will be asked to stand, sit or lie in several different positions and to hold a small amount of fluid in your mouth before being asked to swallow while the imaging is performed .In some instances you may be asked to swallow barium coated marshmallows to assess the motility of a solid food bolus. The exam can be recorded as a digital or video fluoroscopy procedure. POST PROCEDURE It will take 1-2 days for the barium to pass through your system. To facilitate this, it is important, unless otherwise directed, to increase your fluids for the next 24-48hrs and to resume your normal diet.  This test typically takes about 30 minutes to perform. __________________________________________________________________________________   Thank you,  Dr. Jackquline Denmark

## 2021-05-16 ENCOUNTER — Ambulatory Visit (HOSPITAL_COMMUNITY)
Admission: RE | Admit: 2021-05-16 | Discharge: 2021-05-16 | Disposition: A | Discharge status: Home / Self Care | Payer: Medicare Other | Source: Ambulatory Visit | Attending: Gastroenterology | Admitting: Gastroenterology

## 2021-05-16 ENCOUNTER — Other Ambulatory Visit: Payer: Self-pay

## 2021-05-16 DIAGNOSIS — R1319 Other dysphagia: Secondary | ICD-10-CM | POA: Diagnosis present

## 2021-05-16 DIAGNOSIS — K219 Gastro-esophageal reflux disease without esophagitis: Secondary | ICD-10-CM | POA: Diagnosis present

## 2021-06-13 ENCOUNTER — Ambulatory Visit (AMBULATORY_SURGERY_CENTER): Payer: Medicare Other | Admitting: Gastroenterology

## 2021-06-13 ENCOUNTER — Encounter: Payer: Self-pay | Admitting: Gastroenterology

## 2021-06-13 VITALS — BP 124/64 | HR 85 | Temp 97.6°F | Resp 20 | Ht 63.0 in | Wt 169.0 lb

## 2021-06-13 DIAGNOSIS — K3189 Other diseases of stomach and duodenum: Secondary | ICD-10-CM

## 2021-06-13 DIAGNOSIS — K449 Diaphragmatic hernia without obstruction or gangrene: Secondary | ICD-10-CM

## 2021-06-13 DIAGNOSIS — R1310 Dysphagia, unspecified: Secondary | ICD-10-CM

## 2021-06-13 DIAGNOSIS — K317 Polyp of stomach and duodenum: Secondary | ICD-10-CM | POA: Diagnosis not present

## 2021-06-13 DIAGNOSIS — K319 Disease of stomach and duodenum, unspecified: Secondary | ICD-10-CM | POA: Diagnosis not present

## 2021-06-13 DIAGNOSIS — K219 Gastro-esophageal reflux disease without esophagitis: Secondary | ICD-10-CM

## 2021-06-13 MED ORDER — SODIUM CHLORIDE 0.9 % IV SOLN: 500.0000 mL | Freq: Once | INTRAVENOUS | Status: DC

## 2021-06-13 NOTE — Patient Instructions (Signed)
Information on hiatal hernias given to you today.  Await pathology results from the biopsies taken today.  Post dilation diet today - see handout.  Resume previous medications.   YOU HAD AN ENDOSCOPIC PROCEDURE TODAY AT [facility]:   Refer to the procedure report that was given to you for any specific questions about what was found during the examination.  If the procedure report does not answer your questions, please call your gastroenterologist to clarify.  If you requested that your care partner not be given the details of your procedure findings, then the procedure report has been included in a sealed envelope for you to review at your convenience later.  YOU SHOULD EXPECT: Some feelings of bloating in the abdomen. Passage of more gas than usual.  Walking can help get rid of the air that was put into your GI tract during the procedure and reduce the bloating. If you had a lower endoscopy (such as a colonoscopy or flexible sigmoidoscopy) you may notice spotting of blood in your stool or on the toilet paper. If you underwent a bowel prep for your procedure, you may not have a normal bowel movement for a few days.  Please Note:  You might notice some irritation and congestion in your nose or some drainage.  This is from the oxygen used during your procedure.  There is no need for concern and it should clear up in a day or so.  SYMPTOMS TO REPORT IMMEDIATELY:  Following upper endoscopy (EGD)  Vomiting of blood or coffee ground material  New chest pain or pain under the shoulder blades  Painful or persistently difficult swallowing  New shortness of breath  Fever of 100F or higher  Black, tarry-looking stools  For urgent or emergent issues, a gastroenterologist can be reached at any hour by calling [phone]. Do not use MyChart messaging for urgent concerns.    DIET:  We do recommend a small meal at first, but then you may proceed to your regular diet.  Drink plenty  of fluids but you should avoid alcoholic beverages for 24 hours.  ACTIVITY:  You should plan to take it easy for the rest of today and you should NOT DRIVE or use heavy machinery until tomorrow (because of the sedation medicines used during the test).    FOLLOW UP: Our staff will call the number listed on your records 48-72 hours following your procedure to check on you and address any questions or concerns that you may have regarding the information given to you following your procedure. If we do not reach you, we will leave a message.  We will attempt to reach you two times.  During this call, we will ask if you have developed any symptoms of COVID 19. If you develop any symptoms (ie: fever, flu-like symptoms, shortness of breath, cough etc.) before then, please call [phone].  If you test positive for Covid 19 in the 2 weeks post procedure, please call and report this information to [place].    If any biopsies were taken you will be contacted by phone or by letter within the next 1-3 weeks.  Please call us at [phone] if you have not heard about the biopsies in 3 weeks.    SIGNATURES/CONFIDENTIALITY: You and/or your care partner have signed paperwork which will be entered into your electronic medical record.  These signatures attest to the fact that that the information above on your After Visit Summary has been reviewed and is understood.  Full responsibility of the confidentiality of this discharge information lies with you and/or your care-partner.

## 2021-06-13 NOTE — Progress Notes (Signed)
Pt in recovery with monitors in place, VSS. Report given to receiving RN. Bite guard was placed with pt awake to ensure comfort. No dental or soft tissue damage noted. RN will remove the guard when the pt is awake.  

## 2021-06-13 NOTE — Progress Notes (Signed)
Chief Complaint: For GI eval  Referring Provider:  Janie Morning, DO      ASSESSMENT AND PLAN;   #1. GERD with eso dysphagia. D/d includes eso stricture, Schatzki's ring, motility disorder, EoE, pill induced esophagitis, r/o esophageal carcinoma/extrinsic compression d/t goiter.  #2. H/O polyps (tubular adenoma) Feb 2019  #3. IBS-C  Plan: -Omeprazole 20 mg p.o. QD, 1/2 hr before breakfast. #30.  11 refills. -Ba swallow with Ba tablet as well. -EGD with eso bx and possibly dil.  Discussed risks and benefits including small but definite risks of eso perforation, bleeding, risks of anesthesia.  The benefits were also discussed. Consent forms given. -I have instructed patient to chew foods especially meats and breads well and eat slowly. -Miralax 17g po qd to continue. -Colon 07/2022    HPI:    Monica Rocha is a 71 y.o. female  With goiter/hyperthyroidism, breast CA, DM2, OSA, GERD Referred by [facility] for GI eval  C/O Solid food dysphagia-mostly breads and meats- neck/lower chest area x 2 years, intermittently-has goiter on physical exam.  Has reflux which is better with omeprazole.  She has been on omeprazole for 5-6 years.  Had EGD April 2019 as below which showed small hiatal hernia.  She underwent dilatation to 54Fr with improvement x 2 years.  On review of previous notes-she also had EGD February 2016 and was found to have erosive gastritis.  The biopsies were consistent with chronic active gastritis with intestinal metaplasia and low-grade dysplasia.  Of note that most recent biopsies in 2019 did not show any intestinal metaplasia.  The biopsies were negative for H. pylori.  She has longstanding H/O constipation with pellet-like stools. Dx with IBS-C. Better with high-fiber diet and MiraLAX every day.  Has not tried any other medications. Negative colonoscopy except for small colonic polyp as below 07/2017.  For goiter/hyperthyroidism she does follow-up with  endocrinologist.  No melena or hematochezia.  Has history of colonic polyps as below.  No fever chills or night sweats.  No recent weight loss.  No jaundice dark urine or pale stools.  No family history of colon cancer or colonic polyps.    Previous GI work-up:  EGD 08/19/2017 (Dr Harl Favor) -Small hiatal hernia -Dilatation to 75 French -Negative esophageal biopsies for EOE. -Normal stomach/duodenum. Bx-negative.  These biopsies did not reveal any intestinal metaplasia.  Colonoscopy 08/19/2017 (Dr Harl Favor) -3 mm colonic polyp in distal ascending colon/hepatic flexure, internal hemorrhoids, otherwise normal. -Bx-tubular adenoma.  Repeat in 5 years.  Past Medical History:  Diagnosis Date   Acid reflux    Bronchitis    Cancer ([place])    Diabetes mellitus without complication ([place])    Hepatitis A    Peripheral edema    Sleep apnea    Thyroid disease     Past Surgical History:  Procedure Laterality Date   BREAST LUMPECTOMY Left    CHOLECYSTECTOMY      Family History  Problem Relation Age of Onset   Stroke Mother    Cancer Father    Cancer Sister        gall bladder   Colon cancer Neg Hx    Rectal cancer Neg Hx     Social History   Tobacco Use   Smoking status: Never   Smokeless tobacco: Never  Vaping Use   Vaping Use: Never used  Substance Use Topics   Alcohol use: No   Drug use: No    Current Outpatient Medications  Medication Sig Dispense Refill  losartan-hydrochlorothiazide (HYZAAR) 50-12.5 MG tablet Take 1 tablet by mouth daily.     metFORMIN (GLUCOPHAGE) 500 MG tablet Take 500 mg by mouth 2 (two) times daily with a meal.     methimazole (TAPAZOLE) 10 MG tablet Take 10 mg by mouth 2 (two) times daily.     omeprazole (PRILOSEC) 20 MG capsule Take 20 mg by mouth daily.     aspirin 325 MG tablet Take 325 mg by mouth daily.     metoprolol succinate (TOPROL-XL) 25 MG 24 hr tablet Take 25 mg by mouth daily. (Patient not taking: Reported on 06/13/2021)      Current Facility-Administered Medications  Medication Dose Route Frequency Provider Last Rate Last Admin   0.9 %  sodium chloride infusion  500 mL Intravenous Once Jackquline Denmark, MD        Allergies  Allergen Reactions   Penicillins     Review of Systems:  Constitutional: Denies fever, chills, diaphoresis, appetite change and fatigue.  HEENT: Denies photophobia, eye pain, redness, hearing loss, ear pain, congestion, sore throat, rhinorrhea, sneezing, mouth sores, neck pain, neck stiffness and tinnitus.   Respiratory: Denies SOB, DOE, cough, chest tightness,  and wheezing.   Cardiovascular: Denies chest pain, palpitations and leg swelling.  Genitourinary: Denies dysuria, urgency, frequency, hematuria, flank pain and difficulty urinating.  Musculoskeletal: Denies myalgias, back pain, joint swelling, arthralgias and gait problem.  Skin: No rash.  Neurological: Denies dizziness, seizures, syncope, weakness, light-headedness, numbness and headaches.  Hematological: Denies adenopathy. Easy bruising, personal or family bleeding history  Psychiatric/Behavioral: No anxiety or depression     Physical Exam:    BP 139/90    Pulse 84    Temp 97.6 F (36.4 C) (Temporal)    Ht 5\' 3"  (1.6 m)    Wt 169 lb (76.7 kg)    SpO2 98%    BMI 29.94 kg/m  Wt Readings from Last 3 Encounters:  06/13/21 169 lb (76.7 kg)  05/09/21 169 lb 6 oz (76.8 kg)  09/05/19 215 lb (97.5 kg)   Constitutional:  Well-developed, in no acute distress. Psychiatric: Normal mood and affect. Behavior is normal. HEENT: Pupils normal.  Conjunctivae are normal. No scleral icterus. Neck supple.  Has goiter Cardiovascular: Normal rate, regular rhythm. No edema Pulmonary/chest: Effort normal and breath sounds normal. No wheezing, rales or rhonchi. Abdominal: Soft, nondistended. Nontender. Bowel sounds active throughout. There are no masses palpable. No hepatomegaly. Rectal: Deferred Neurological: Alert and oriented to person  place and time. Skin: Skin is warm and dry. No rashes noted.  Data Reviewed: I have personally reviewed following labs and imaging studies  CBC: CBC Latest Ref Rng & Units 05/23/2016  WBC 3.6 - 11.0 K/uL 11.9(H)  Hemoglobin 12.0 - 16.0 g/dL 13.8  Hematocrit 35.0 - 47.0 % 41.5  Platelets 150 - 440 K/uL 217    CMP: CMP Latest Ref Rng & Units 05/23/2016  Glucose 65 - 99 mg/dL 98  BUN 6 - 20 mg/dL 13  Creatinine 0.44 - 1.00 mg/dL 1.03(H)  Sodium 135 - 145 mmol/L 138  Potassium 3.5 - 5.1 mmol/L 4.1  Chloride 101 - 111 mmol/L 105  CO2 22 - 32 mmol/L 28  Calcium 8.9 - 10.3 mg/dL 9.4  Total Protein 6.5 - 8.1 g/dL 6.9  Total Bilirubin 0.3 - 1.2 mg/dL 0.6  Alkaline Phos 38 - 126 U/L 77  AST 15 - 41 U/L 20  ALT 14 - 54 U/L 16    GFR: CrCl cannot be calculated (Patient's most  recent lab result is older than the maximum 21 days allowed.). Liver Function Tests: No results for input(s): AST, ALT, ALKPHOS, BILITOT, PROT, ALBUMIN in the last 168 hours. No results for input(s): LIPASE, AMYLASE in the last 168 hours. No results for input(s): AMMONIA in the last 168 hours. Coagulation Profile: No results for input(s): INR, PROTIME in the last 168 hours. HbA1C: No results for input(s): HGBA1C in the last 72 hours. Lipid Profile: No results for input(s): CHOL, HDL, LDLCALC, TRIG, CHOLHDL, LDLDIRECT in the last 72 hours. Thyroid Function Tests: No results for input(s): TSH, T4TOTAL, FREET4, T3FREE, THYROIDAB in the last 72 hours. Anemia Panel: No results for input(s): VITAMINB12, FOLATE, FERRITIN, TIBC, IRON, RETICCTPCT in the last 72 hours.  No results found for this or any previous visit (from the past 240 hour(s)).    Radiology Studies: DG ESOPHAGUS W DOUBLE CM (HD)  Addendum Date: 05/16/2021   ADDENDUM REPORT: 05/16/2021 14:46 ADDENDUM: The examination was performed by Candiss Norse, PA-C. Electronically Signed   By: Kellie Simmering D.O.   On: 05/16/2021 14:46   Result Date:  05/16/2021 CLINICAL DATA:  Provided history: Gastroesophageal reflux disease, unspecified whether esophagitis present. Esophageal dysphagia. Dysphagia, GERD. EXAM: ESOPHOGRAM / BARIUM SWALLOW / BARIUM TABLET STUDY TECHNIQUE: Combined double contrast and single contrast examination performed using effervescent crystals, thick barium liquid, and thin barium liquid. The patient was observed with fluoroscopy swallowing a 13 mm barium sulphate tablet. FLUOROSCOPY TIME:  Fluoroscopy Time:  2 minutes Radiation Exposure Index (if provided by the fluoroscopic device): 13.50 mGy Number of Acquired Spot Images: 3 COMPARISON:  Esophagram 06/11/2008. FINDINGS: Fluoroscopic evaluation demonstrates normal caliber and smooth contour of the esophagus. No evidence of fixed stricture, mass or mucosal abnormality. Normal esophageal motility was observed. A very small sliding hiatal hernia is questioned. No gastroesophageal reflux was observed. The patient swallowed a 13 mm barium tablet, which freely passed into the stomach. IMPRESSION: A very small sliding hiatal hernia is questioned. Otherwise unremarkable esophagram, as described. Electronically Signed: By: Kellie Simmering D.O. On: 05/16/2021 12:06      Carmell Austria, MD 06/13/2021, 3:59 PM  Cc: Janie Morning, DO

## 2021-06-13 NOTE — Op Note (Signed)
[facility] Patient Name: Monica Rocha Procedure Date: 06/13/2021 3:42 PM MRN: [id number] Endoscopist: Jackquline Denmark , MD Age: 71 Referring MD:  Date of Birth: January 03, 1951 Gender: Female Account #: [id number] Procedure:                Upper GI endoscopy Indications:              Dysphagia with neg barium swallow except for small                            hiatal hernia. Medicines:                Monitored Anesthesia Care Procedure:                Pre-Anesthesia Assessment:                           - Prior to the procedure, a History and Physical                            was performed, and patient medications and                            allergies were reviewed. The patient's tolerance of                            previous anesthesia was also reviewed. The risks                            and benefits of the procedure and the sedation                            options and risks were discussed with the patient.                            All questions were answered, and informed consent                            was obtained. Prior Anticoagulants: The patient has                            taken no previous anticoagulant or antiplatelet                            agents. ASA Grade Assessment: II - A patient with                            mild systemic disease. After reviewing the risks                            and benefits, the patient was deemed in                            satisfactory condition to undergo the procedure.  After obtaining informed consent, the endoscope was                            passed under direct vision. Throughout the                            procedure, the patient's blood pressure, pulse, and                            oxygen saturations were monitored continuously. The                            GIF [id number] #[id number] was introduced through the                            mouth, and advanced to the second part of  duodenum.                            The upper GI endoscopy was accomplished without                            difficulty. The patient tolerated the procedure                            well. Scope In: Scope Out: Findings:                 The lower third of the esophagus was mildly                            tortuous with well-defined Z-line at 36 cm,                            examined by NBI. The scope was withdrawn. Dilation                            was performed with a Maloney dilator with mild                            resistance at 69 Fr and 61 Fr. esophagus was not                            biopsied since previous biopsies were negative for                            EoE                           A small hiatal hernia was present.                           The entire gastric mucosa was normal. Biopsies were                            taken with  a cold forceps for histology ([place]                            protocol d/t previous H/O intestinal metaplasia).                           Multiple (12-15) 4 to 6 mm sessile polyps with no                            bleeding and no stigmata of recent bleeding were                            found in the gastric fundus and in the gastric                            body. Three polyps were removed with a cold snare.                            Resection and retrieval were complete.                           The examined duodenum was normal. Complications:            No immediate complications. Estimated Blood Loss:     Estimated blood loss: none. Impression:               - Presbyesophagus s/p esophageal dilatation.                           - Small hiatal hernia.                           - Multiple gastric polyps. Resected and retrieved x                            3.                           - Normal examined duodenum. Recommendation:           - Patient has a contact number available for                            emergencies. The  signs and symptoms of potential                            delayed complications were discussed with the                            patient. Return to normal activities tomorrow.                            Written discharge instructions were provided to the                            patient.                           -  Post dilatation diet.                           - Continue present medications.                           - Await pathology results.                           - The findings and recommendations were discussed                            with the patient's family. Jackquline Denmark, MD 06/13/2021 4:27:40 PM This report has been signed electronically.

## 2021-06-13 NOTE — Progress Notes (Signed)
Pt's states no medical or surgical changes since previsit or office visit. 

## 2021-06-13 NOTE — Progress Notes (Signed)
Called to room to assist during endoscopic procedure.  Patient ID and intended procedure confirmed with present staff. Received instructions for my participation in the procedure from the performing physician.  

## 2021-06-18 ENCOUNTER — Telehealth: Payer: Self-pay

## 2021-06-18 NOTE — Telephone Encounter (Signed)
Attempted f/u call back. No answer, left VM. 

## 2021-06-23 ENCOUNTER — Encounter: Payer: Self-pay | Admitting: Gastroenterology

## 2022-05-12 ENCOUNTER — Other Ambulatory Visit: Payer: Self-pay | Admitting: Nurse Practitioner

## 2022-05-12 DIAGNOSIS — E041 Nontoxic single thyroid nodule: Secondary | ICD-10-CM

## 2022-05-15 ENCOUNTER — Ambulatory Visit
Admission: RE | Admit: 2022-05-15 | Discharge: 2022-05-15 | Disposition: A | Discharge status: Home / Self Care | Payer: Medicare Other | Source: Ambulatory Visit | Attending: Nurse Practitioner | Admitting: Nurse Practitioner

## 2022-05-15 DIAGNOSIS — E041 Nontoxic single thyroid nodule: Secondary | ICD-10-CM

## 2022-05-25 ENCOUNTER — Other Ambulatory Visit: Payer: Self-pay | Admitting: Family Medicine

## 2022-05-25 DIAGNOSIS — E041 Nontoxic single thyroid nodule: Secondary | ICD-10-CM

## 2022-06-10 ENCOUNTER — Other Ambulatory Visit (HOSPITAL_COMMUNITY)
Admission: RE | Admit: 2022-06-10 | Discharge: 2022-06-10 | Disposition: A | Discharge status: Home / Self Care | Payer: Medicare Other | Source: Ambulatory Visit | Attending: Family Medicine | Admitting: Family Medicine

## 2022-06-10 ENCOUNTER — Ambulatory Visit
Admission: RE | Admit: 2022-06-10 | Discharge: 2022-06-10 | Disposition: A | Discharge status: Home / Self Care | Payer: TRICARE For Life (TFL) | Source: Ambulatory Visit | Attending: Family Medicine | Admitting: Family Medicine

## 2022-06-10 DIAGNOSIS — E041 Nontoxic single thyroid nodule: Secondary | ICD-10-CM | POA: Diagnosis present

## 2022-06-12 LAB — CYTOLOGY - NON PAP

## 2022-07-28 ENCOUNTER — Encounter: Payer: Self-pay | Admitting: Gastroenterology

## 2023-05-26 ENCOUNTER — Other Ambulatory Visit: Payer: Self-pay | Admitting: Nurse Practitioner

## 2023-05-26 DIAGNOSIS — E041 Nontoxic single thyroid nodule: Secondary | ICD-10-CM

## 2023-06-01 ENCOUNTER — Ambulatory Visit
Admission: RE | Admit: 2023-06-01 | Discharge: 2023-06-01 | Disposition: A | Discharge status: Home / Self Care | Payer: Medicare Other | Source: Ambulatory Visit | Attending: Nurse Practitioner | Admitting: Nurse Practitioner

## 2023-06-01 DIAGNOSIS — E041 Nontoxic single thyroid nodule: Secondary | ICD-10-CM

## 2023-06-06 ENCOUNTER — Other Ambulatory Visit: Payer: Self-pay

## 2023-06-06 ENCOUNTER — Emergency Department (HOSPITAL_BASED_OUTPATIENT_CLINIC_OR_DEPARTMENT_OTHER)
Admission: EM | Admit: 2023-06-06 | Discharge: 2023-06-06 | Disposition: A | Discharge status: Home / Self Care | Payer: Medicare Other | Attending: Emergency Medicine | Admitting: Emergency Medicine

## 2023-06-06 ENCOUNTER — Encounter (HOSPITAL_BASED_OUTPATIENT_CLINIC_OR_DEPARTMENT_OTHER): Payer: Self-pay | Admitting: Emergency Medicine

## 2023-06-06 DIAGNOSIS — Z7984 Long term (current) use of oral hypoglycemic drugs: Secondary | ICD-10-CM | POA: Diagnosis not present

## 2023-06-06 DIAGNOSIS — T383X1A Poisoning by insulin and oral hypoglycemic [antidiabetic] drugs, accidental (unintentional), initial encounter: Secondary | ICD-10-CM | POA: Diagnosis present

## 2023-06-06 DIAGNOSIS — Z794 Long term (current) use of insulin: Secondary | ICD-10-CM | POA: Insufficient documentation

## 2023-06-06 DIAGNOSIS — E119 Type 2 diabetes mellitus without complications: Secondary | ICD-10-CM | POA: Insufficient documentation

## 2023-06-06 DIAGNOSIS — T50901A Poisoning by unspecified drugs, medicaments and biological substances, accidental (unintentional), initial encounter: Secondary | ICD-10-CM

## 2023-06-06 LAB — CBG MONITORING, ED: Glucose-Capillary: 83 mg/dL (ref 70–99)

## 2023-06-06 MED ORDER — SEMAGLUTIDE(0.25 OR 0.5MG/DOS) 2 MG/3ML ~~LOC~~ SOPN: 0.2500 mg | PEN_INJECTOR | SUBCUTANEOUS | 0 refills | Status: AC

## 2023-06-06 MED ORDER — ONDANSETRON 4 MG PO TBDP: 4.0000 mg | ORAL_TABLET | Freq: Three times a day (TID) | ORAL | 0 refills | Status: AC | PRN

## 2023-06-06 NOTE — ED Notes (Signed)
 Pt states she believes she took 2 extra doses of ozempic  at home. Denies abd pain, NVD, constipation, SHOB, chest pain, dizziness, headache, blurry vision. States she feels like her usual self just worried about possible future effects no obvious signs of acute distress upon assessment

## 2023-06-06 NOTE — Discharge Instructions (Signed)
 You were seen for your Ozempic  overdose in the emergency department.   At home, please continue to take your blood sugar daily or if you experience any concerning symptoms.    Check your MyChart online for the results of any tests that had not resulted by the time you left the emergency department.   Follow-up with your primary doctor tomorrow regarding your visit.    Return immediately to the emergency department if you experience any of the following: Seizures, sweating, lightheadedness, or any other concerning symptoms.    Thank you for visiting our Emergency Department. It was a pleasure taking care of you today.

## 2023-06-06 NOTE — ED Notes (Signed)

## 2023-06-06 NOTE — ED Provider Notes (Signed)
 Monica Rocha Provider Note   CSN: [id number] Arrival date & time: 06/06/23  1730     History  Chief Complaint  Patient presents with   Drug Overdose    Monica Rocha is a 73 y.o. female.  73 yo F with hx of diabetes who presents with concern for ozempic  overdose. At 4pm today she took approx 0.75 mg of ozempic . Does not currently have any symptoms. Does have something to take her blood sugar with at home.        Home Medications Prior to Admission medications   Medication Sig Start Date End Date Taking? Authorizing Provider  ondansetron  (ZOFRAN -ODT) 4 MG disintegrating tablet Take 1 tablet (4 mg total) by mouth every 8 (eight) hours as needed for nausea or vomiting. 06/06/23  Yes Yolande Lamar BROCKS, MD  Semaglutide ,0.25 or 0.5MG /DOS, 2 MG/3ML SOPN Inject 0.25 mg into the skin once a week. 06/06/23  Yes Yolande Lamar BROCKS, MD  aspirin 325 MG tablet Take 325 mg by mouth daily.    [provider]  losartan-hydrochlorothiazide (HYZAAR) 50-12.5 MG tablet Take 1 tablet by mouth daily.    [provider]  metFORMIN (GLUCOPHAGE) 500 MG tablet Take 500 mg by mouth 2 (two) times daily with a meal.    [provider]  methimazole (TAPAZOLE) 10 MG tablet Take 10 mg by mouth 2 (two) times daily. 05/01/21   [provider]  metoprolol succinate (TOPROL-XL) 25 MG 24 hr tablet Take 25 mg by mouth daily. Patient not taking: Reported on 06/13/2021 05/01/21   [provider]  omeprazole (PRILOSEC) 20 MG capsule Take 20 mg by mouth daily.    [provider]      Allergies    Penicillins    Review of Systems   Review of Systems  Physical Exam Updated Vital Signs BP 138/83   Pulse 83   Temp 97.8 F (36.6 C)   Resp 18   Ht 5' 2 (1.575 m)   Wt 98.4 kg   SpO2 98%   BMI 39.69 kg/m  Physical Exam Vitals and nursing note reviewed.  Constitutional:      General: She is not in acute distress.     Appearance: She is well-developed.  HENT:     Head: Normocephalic and atraumatic.     Right Ear: External ear normal.     Left Ear: External ear normal.     Nose: Nose normal.  Eyes:     Extraocular Movements: Extraocular movements intact.     Conjunctiva/sclera: Conjunctivae normal.     Pupils: Pupils are equal, round, and reactive to light.  Pulmonary:     Effort: Pulmonary effort is normal. No respiratory distress.  Musculoskeletal:     Cervical back: Normal range of motion and neck supple.     Right lower leg: No edema.     Left lower leg: No edema.  Skin:    General: Skin is warm and dry.  Neurological:     Mental Status: She is alert and oriented to person, place, and time. Mental status is at baseline.  Psychiatric:        Mood and Affect: Mood normal.     ED Results / Procedures / Treatments   Labs (all labs ordered are listed, but only abnormal results are displayed) Labs Reviewed  CBG MONITORING, ED    EKG None  Radiology No results found.  Procedures Procedures    Medications Ordered in ED Medications -  No data to display  ED Course/ Medical Decision Making/ A&P Clinical Course as of 06/07/23 1317  Sun Jun 06, 2023  8141 Discussed with poison control.  Felt that the patient was at low risk for hypoglycemic episodes.  Says that she is more likely to experience nausea, vomiting, headache, and dizziness.  Recommend following up with her primary doctor. [RP]    Clinical Course User Index [RP] Yolande Lamar BROCKS, MD                                 Medical Decision Making Risk Prescription drug management.   Monica Rocha is a 73 y.o. female with comorbidities that complicate the patient evaluation including diabetes on Ozempic  who presents emergency department after administering too much Ozempic   Initial Ddx:  Ozempic  overdose, suicide attempt, hypoglycemia  MDM/Course:  Patient reports to the emergency department after administering 3 doses  of Ozempic  to herself today instead of 1.  Currently asymptomatic.  Blood pressure WNL on arrival.  Reports that this was accidental and not a suicide attempt.  Did discuss with poison control who reports that she should look out for the GI side effects but that hypoglycemia is less likely.  She does have a glucose monitor at home that she will use if she starts to develop any concerning symptoms and will eat or drink something that contain sugar if she gets hypoglycemic and come to the emergency department.  Given a prescription for Ozempic  for next week which she reports that she will administer to herself with the assistance of a nurse.  Also instructed her to follow-up with her primary doctor tomorrow.  This patient presents to the ED for concern of complaints listed in HPI, this involves an extensive number of treatment options, and is a complaint that carries with it a high risk of complications and morbidity. Disposition including potential need for admission considered.   Dispo: DC Home. Return precautions discussed including, but not limited to, those listed in the AVS. Allowed pt time to ask questions which were answered fully prior to dc.  Records reviewed Outpatient Clinic Notes I have reviewed the patients home medications and made adjustments as needed Social Determinants of health:  Elderly  Portions of this note were generated with Scientist, clinical (histocompatibility and immunogenetics). Dictation errors may occur despite best attempts at proofreading.     Final Clinical Impression(s) / ED Diagnoses Final diagnoses:  Medication overdose, accidental or unintentional, initial encounter    Rx / DC Orders ED Discharge Orders          Ordered    Semaglutide ,0.25 or 0.5MG /DOS, 2 MG/3ML SOPN  Weekly        06/06/23 1900    ondansetron  (ZOFRAN -ODT) 4 MG disintegrating tablet  Every 8 hours PRN        06/06/23 1901              Yolande Lamar BROCKS, MD 06/07/23 1317

## 2023-06-06 NOTE — ED Notes (Signed)
 Cannot discharge, registration in chart.

## 2023-06-06 NOTE — ED Triage Notes (Signed)
 Pt thinks she took 2 extra doses of Ozempic  about 1 hr ago; she did not think anything was coming out of the syringe, so she gave herself 2 more injections and now the syringe is empty

## 2024-02-09 ENCOUNTER — Other Ambulatory Visit (HOSPITAL_COMMUNITY): Payer: Self-pay | Admitting: Nurse Practitioner

## 2024-02-09 DIAGNOSIS — E0590 Thyrotoxicosis, unspecified without thyrotoxic crisis or storm: Secondary | ICD-10-CM

## 2024-02-28 ENCOUNTER — Ambulatory Visit (HOSPITAL_COMMUNITY)
Admission: RE | Admit: 2024-02-28 | Discharge: 2024-02-28 | Disposition: A | Discharge status: Home / Self Care | Source: Ambulatory Visit | Attending: Nurse Practitioner | Admitting: Nurse Practitioner

## 2024-02-28 DIAGNOSIS — E0590 Thyrotoxicosis, unspecified without thyrotoxic crisis or storm: Secondary | ICD-10-CM | POA: Diagnosis present

## 2024-02-28 MED ORDER — SODIUM IODIDE I-123 7.4 MBQ CAPS
439.0000 | ORAL_CAPSULE | Freq: Once | ORAL | Status: AC
  Administered 2024-02-28: 439 via ORAL

## 2024-02-29 ENCOUNTER — Ambulatory Visit (HOSPITAL_COMMUNITY)
Admission: RE | Admit: 2024-02-29 | Discharge: 2024-02-29 | Disposition: A | Discharge status: Home / Self Care | Source: Ambulatory Visit | Attending: Nurse Practitioner | Admitting: Nurse Practitioner

## 2024-03-29 ENCOUNTER — Other Ambulatory Visit: Payer: Self-pay | Admitting: Family Medicine

## 2024-03-29 DIAGNOSIS — Z1231 Encounter for screening mammogram for malignant neoplasm of breast: Secondary | ICD-10-CM

## 2024-04-28 ENCOUNTER — Other Ambulatory Visit: Payer: Self-pay | Admitting: Family Medicine

## 2024-04-28 DIAGNOSIS — N63 Unspecified lump in unspecified breast: Secondary | ICD-10-CM

## 2024-06-02 ENCOUNTER — Ambulatory Visit: Admission: RE | Admit: 2024-06-02 | Source: Ambulatory Visit

## 2024-06-02 ENCOUNTER — Other Ambulatory Visit: Payer: Self-pay | Admitting: Family Medicine

## 2024-06-02 DIAGNOSIS — N63 Unspecified lump in unspecified breast: Secondary | ICD-10-CM

## 2024-06-02 DIAGNOSIS — Z9889 Other specified postprocedural states: Secondary | ICD-10-CM

## 2024-06-02 DIAGNOSIS — C50112 Malignant neoplasm of central portion of left female breast: Secondary | ICD-10-CM

## 2024-09-04 ENCOUNTER — Ambulatory Visit
